# Patient Record
Sex: Female | Born: 1965 | Hispanic: Yes | Marital: Married | Smoking: Never smoker
Health system: Southern US, Community
[De-identification: ages and names within clinical notes are randomized; demographics above are authoritative.]

## PROBLEM LIST (undated history)

## (undated) DIAGNOSIS — K219 Gastro-esophageal reflux disease without esophagitis: Secondary | ICD-10-CM

## (undated) DIAGNOSIS — J45909 Unspecified asthma, uncomplicated: Secondary | ICD-10-CM

## (undated) HISTORY — PX: LAPAROSCOPIC BILATERAL SALPINGO OOPHERECTOMY: SHX5890

## (undated) HISTORY — DX: Gastro-esophageal reflux disease without esophagitis: K21.9

## (undated) HISTORY — PX: ABDOMINAL HYSTERECTOMY: SHX81

## (undated) HISTORY — DX: Unspecified asthma, uncomplicated: J45.909

---

## 2004-10-07 HISTORY — PX: OVARIAN CYST REMOVAL: SHX89

## 2018-03-06 DIAGNOSIS — R109 Unspecified abdominal pain: Secondary | ICD-10-CM | POA: Diagnosis not present

## 2018-03-06 DIAGNOSIS — K219 Gastro-esophageal reflux disease without esophagitis: Secondary | ICD-10-CM | POA: Diagnosis not present

## 2018-04-06 DIAGNOSIS — K219 Gastro-esophageal reflux disease without esophagitis: Secondary | ICD-10-CM | POA: Diagnosis not present

## 2018-04-06 DIAGNOSIS — M25512 Pain in left shoulder: Secondary | ICD-10-CM | POA: Diagnosis not present

## 2018-04-06 DIAGNOSIS — G8929 Other chronic pain: Secondary | ICD-10-CM | POA: Diagnosis not present

## 2018-04-29 DIAGNOSIS — M7502 Adhesive capsulitis of left shoulder: Secondary | ICD-10-CM | POA: Diagnosis not present

## 2018-04-29 DIAGNOSIS — M25512 Pain in left shoulder: Secondary | ICD-10-CM | POA: Diagnosis not present

## 2018-04-29 DIAGNOSIS — G8929 Other chronic pain: Secondary | ICD-10-CM | POA: Diagnosis not present

## 2018-05-20 DIAGNOSIS — M25512 Pain in left shoulder: Secondary | ICD-10-CM | POA: Diagnosis not present

## 2018-05-20 DIAGNOSIS — R29898 Other symptoms and signs involving the musculoskeletal system: Secondary | ICD-10-CM | POA: Diagnosis not present

## 2018-05-27 DIAGNOSIS — M7502 Adhesive capsulitis of left shoulder: Secondary | ICD-10-CM | POA: Diagnosis not present

## 2018-06-10 DIAGNOSIS — R29898 Other symptoms and signs involving the musculoskeletal system: Secondary | ICD-10-CM | POA: Diagnosis not present

## 2018-06-10 DIAGNOSIS — M25512 Pain in left shoulder: Secondary | ICD-10-CM | POA: Diagnosis not present

## 2018-06-30 DIAGNOSIS — R29898 Other symptoms and signs involving the musculoskeletal system: Secondary | ICD-10-CM | POA: Diagnosis not present

## 2018-06-30 DIAGNOSIS — M25512 Pain in left shoulder: Secondary | ICD-10-CM | POA: Diagnosis not present

## 2018-08-13 ENCOUNTER — Ambulatory Visit: Payer: BLUE CROSS/BLUE SHIELD | Admitting: Family Medicine

## 2018-08-13 ENCOUNTER — Encounter: Payer: Self-pay | Admitting: Family Medicine

## 2018-08-13 VITALS — BP 120/80 | HR 64 | Ht <= 58 in | Wt 125.0 lb

## 2018-08-13 DIAGNOSIS — Z7689 Persons encountering health services in other specified circumstances: Secondary | ICD-10-CM | POA: Diagnosis not present

## 2018-08-13 DIAGNOSIS — J4520 Mild intermittent asthma, uncomplicated: Secondary | ICD-10-CM

## 2018-08-13 MED ORDER — ALBUTEROL SULFATE HFA 108 (90 BASE) MCG/ACT IN AERS: 2.0000 | INHALATION_SPRAY | Freq: Four times a day (QID) | RESPIRATORY_TRACT | 2 refills | Status: DC | PRN

## 2018-08-13 NOTE — Progress Notes (Signed)
Date:  08/13/2018   Name:  Kaylee Edwards   DOB:  [birth year]   MRN:  [id number]   Chief Complaint: Establish Care Patient is a 52 year old female who presents for a establish care with new physician exam. The patient reports the following problems: gerd. Health maintenance has been reviewed Up to date.   Review of Systems  Constitutional: Negative.  Negative for chills, fatigue, fever and unexpected weight change.  HENT: Negative for congestion, ear discharge, ear pain, rhinorrhea, sinus pressure, sneezing and sore throat.   Eyes: Negative for photophobia, pain, discharge, redness and itching.  Respiratory: Negative for cough, shortness of breath, wheezing and stridor.   Gastrointestinal: Positive for nausea. Negative for abdominal pain, blood in stool, constipation, diarrhea and vomiting.  Endocrine: Negative for cold intolerance, heat intolerance, polydipsia, polyphagia and polyuria.  Genitourinary: Negative for dysuria, flank pain, frequency, hematuria, menstrual problem, pelvic pain, urgency, vaginal bleeding and vaginal discharge.  Musculoskeletal: Negative for arthralgias, back pain and myalgias.  Skin: Negative for rash.  Allergic/Immunologic: Negative for environmental allergies and food allergies.  Neurological: Positive for light-headedness and headaches. Negative for dizziness, weakness and numbness.  Hematological: Negative for adenopathy. Does not bruise/bleed easily.  Psychiatric/Behavioral: Negative for dysphoric mood. The patient is not nervous/anxious.     There are no active problems to display for this patient.   No Known Allergies  Past Surgical History:  Procedure Laterality Date  . OVARIAN CYST REMOVAL Bilateral 2006    Social History   Tobacco Use  . Smoking status: Never Smoker  . Smokeless tobacco: Never Used  Substance Use Topics  . Alcohol use: Never    Frequency: Never  . Drug use: Never     Medication list has been reviewed and  updated.  Current Meds  Medication Sig  . ranitidine (ZANTAC) 300 MG tablet Take 300 mg by mouth at bedtime. As needed    PHQ 2/9 Scores 08/13/2018  PHQ - 2 Score 0  PHQ- 9 Score 2    Physical Exam  Constitutional: She is oriented to person, place, and time. She appears well-developed and well-nourished.  HENT:  Head: Normocephalic.  Right Ear: External ear normal.  Left Ear: External ear normal.  Mouth/Throat: Oropharynx is clear and moist.  Eyes: Pupils are equal, round, and reactive to light. Conjunctivae and EOM are normal. Lids are everted and swept, no foreign bodies found. Left eye exhibits no hordeolum. No foreign body present in the left eye. Right conjunctiva is not injected. Left conjunctiva is not injected. No scleral icterus.  Neck: Normal range of motion. Neck supple. No JVD present. No tracheal deviation present. No thyromegaly present.  Cardiovascular: Normal rate, regular rhythm, normal heart sounds and intact distal pulses. Exam reveals no gallop and no friction rub.  No murmur heard. Pulmonary/Chest: Effort normal and breath sounds normal. No respiratory distress. She has no wheezes. She has no rales.  Abdominal: Soft. Bowel sounds are normal. She exhibits no mass. There is no hepatosplenomegaly. There is no tenderness. There is no rebound and no guarding.  Musculoskeletal: Normal range of motion. She exhibits no edema or tenderness.  Lymphadenopathy:    She has no cervical adenopathy.  Neurological: She is alert and oriented to person, place, and time. She has normal strength. She displays normal reflexes. No cranial nerve deficit.  Skin: Skin is warm. No rash noted.  Psychiatric: She has a normal mood and affect. Her mood appears not anxious. She does not exhibit a depressed  mood.  Nursing note and vitals reviewed.   BP 120/80   Pulse 64   Ht 4\' 10"  (1.473 m)   Wt 125 lb (56.7 kg)   BMI 26.13 kg/m   Assessment and Plan:  1. Establishing care with new  doctor, encounter for Encounter to establish care with new physician.  2. Mild intermittent asthma without complication Chronic.  Intermittent.  Without acute episode.  Patient desires refill on albuterol inhaler 2 puffs every 6 hours as needed wheezing. - albuterol (PROVENTIL HFA;VENTOLIN HFA) 108 (90 Base) MCG/ACT inhaler; Inhale 2 puffs into the lungs every 6 (six) hours as needed for wheezing or shortness of breath.  Dispense: 1 Inhaler; Refill: 2   Dr. [facility]  08/13/2018

## 2018-09-25 ENCOUNTER — Ambulatory Visit (INDEPENDENT_AMBULATORY_CARE_PROVIDER_SITE_OTHER): Payer: BLUE CROSS/BLUE SHIELD | Admitting: Family Medicine

## 2018-09-25 ENCOUNTER — Encounter: Payer: Self-pay | Admitting: Family Medicine

## 2018-09-25 ENCOUNTER — Other Ambulatory Visit (HOSPITAL_COMMUNITY)
Admission: RE | Admit: 2018-09-25 | Discharge: 2018-09-25 | Disposition: A | Discharge status: Home / Self Care | Payer: BLUE CROSS/BLUE SHIELD | Source: Ambulatory Visit | Attending: Family Medicine | Admitting: Family Medicine

## 2018-09-25 VITALS — BP 120/70 | HR 64 | Ht <= 58 in | Wt 128.0 lb

## 2018-09-25 DIAGNOSIS — Z1272 Encounter for screening for malignant neoplasm of vagina: Secondary | ICD-10-CM

## 2018-09-25 DIAGNOSIS — Z0000 Encounter for general adult medical examination without abnormal findings: Secondary | ICD-10-CM | POA: Insufficient documentation

## 2018-09-25 DIAGNOSIS — K219 Gastro-esophageal reflux disease without esophagitis: Secondary | ICD-10-CM | POA: Diagnosis not present

## 2018-09-25 DIAGNOSIS — J40 Bronchitis, not specified as acute or chronic: Secondary | ICD-10-CM

## 2018-09-25 DIAGNOSIS — Z1211 Encounter for screening for malignant neoplasm of colon: Secondary | ICD-10-CM

## 2018-09-25 LAB — HEMOCCULT GUIAC POC 1CARD (OFFICE): Fecal Occult Blood, POC: NEGATIVE

## 2018-09-25 MED ORDER — BENZONATATE 100 MG PO CAPS: 100.0000 mg | ORAL_CAPSULE | Freq: Two times a day (BID) | ORAL | 0 refills | Status: DC

## 2018-09-25 NOTE — Progress Notes (Signed)
Date:  09/25/2018   Name:  Kaylee Edwards   DOB:  02/08/1966   MRN:  [id number]   Chief Complaint: Annual Exam (needs pap, mammo/ sign a release of info downstairs, and colonoscopy)  Patient is a 52 year old female who presents for a comprehensive physical exam. The patient reports the following problems: cough. Health maintenance has been reviewed pap and pelvic.  Gastroesophageal Reflux  She complains of heartburn. She reports no abdominal pain, no belching, no chest pain, no choking, no coughing, no dysphagia, no globus sensation, no hoarse voice, no nausea, no sore throat, no stridor, no tooth decay, no water brash or no wheezing. This is a chronic problem. The problem occurs occasionally. The problem has been unchanged. The symptoms are aggravated by certain foods (fatty). Pertinent negatives include no anemia, fatigue, melena, orthopnea or weight loss.  Cough  This is a new problem. The current episode started in the past 7 days. The problem has been unchanged. The cough is non-productive. Associated symptoms include heartburn. Pertinent negatives include no chest pain, chills, ear pain, eye redness, fever, headaches, myalgias, rash, rhinorrhea, sore throat, shortness of breath, weight loss or wheezing. There is no history of environmental allergies.    Review of Systems  Constitutional: Negative.  Negative for chills, fatigue, fever, unexpected weight change and weight loss.  HENT: Negative for congestion, ear discharge, ear pain, hoarse voice, rhinorrhea, sinus pressure, sneezing and sore throat.   Eyes: Negative for photophobia, pain, discharge, redness and itching.  Respiratory: Negative for cough, choking, shortness of breath, wheezing and stridor.   Cardiovascular: Negative for chest pain.  Gastrointestinal: Positive for heartburn. Negative for abdominal pain, blood in stool, constipation, diarrhea, dysphagia, melena, nausea and vomiting.  Endocrine: Negative for cold  intolerance, heat intolerance, polydipsia, polyphagia and polyuria.  Genitourinary: Negative for dysuria, flank pain, frequency, hematuria, menstrual problem, pelvic pain, urgency, vaginal bleeding and vaginal discharge.  Musculoskeletal: Negative for arthralgias, back pain and myalgias.  Skin: Negative for rash.  Allergic/Immunologic: Negative for environmental allergies and food allergies.  Neurological: Negative for dizziness, weakness, light-headedness, numbness and headaches.  Hematological: Negative for adenopathy. Does not bruise/bleed easily.  Psychiatric/Behavioral: Negative for dysphoric mood. The patient is not nervous/anxious.     There are no active problems to display for this patient.   No Known Allergies  Past Surgical History:  Procedure Laterality Date  . OVARIAN CYST REMOVAL Bilateral 2006    Social History   Tobacco Use  . Smoking status: Never Smoker  . Smokeless tobacco: Never Used  Substance Use Topics  . Alcohol use: Never    Frequency: Never  . Drug use: Never     Medication list has been reviewed and updated.  Current Meds  Medication Sig  . albuterol (PROVENTIL HFA;VENTOLIN HFA) 108 (90 Base) MCG/ACT inhaler Inhale 2 puffs into the lungs every 6 (six) hours as needed for wheezing or shortness of breath.    PHQ 2/9 Scores 08/13/2018  PHQ - 2 Score 0  PHQ- 9 Score 2    Physical Exam Vitals signs and nursing note reviewed. Exam conducted with a chaperone present.  Constitutional:      General: She is not in acute distress.    Appearance: She is not diaphoretic.  HENT:     Head: Normocephalic and atraumatic.     Right Ear: External ear normal.     Left Ear: External ear normal.     Nose: Nose normal.  Eyes:  General:        Right eye: No discharge.        Left eye: No discharge.     Conjunctiva/sclera: Conjunctivae normal.     Pupils: Pupils are equal, round, and reactive to light.  Neck:     Musculoskeletal: Normal range of motion  and neck supple.     Thyroid: No thyromegaly.     Vascular: No JVD.  Cardiovascular:     Rate and Rhythm: Normal rate and regular rhythm.     Heart sounds: Normal heart sounds. No murmur. No friction rub. No gallop.   Pulmonary:     Effort: Pulmonary effort is normal.     Breath sounds: Normal breath sounds.  Chest:     Chest wall: No mass.     Breasts:        Right: Normal. No swelling, bleeding, inverted nipple, mass, nipple discharge, skin change or tenderness.        Left: Normal. No swelling, bleeding, inverted nipple, mass, nipple discharge, skin change or tenderness.  Abdominal:     General: Bowel sounds are normal.     Palpations: Abdomen is soft. There is no mass.     Tenderness: There is no abdominal tenderness. There is no guarding.  Genitourinary:    General: Normal vulva.     Exam position: Supine.     Pubic Area: No rash.      Labia:        Right: No rash, tenderness or lesion.        Left: No rash, tenderness or lesion.      Urethra: No prolapse, urethral swelling or urethral lesion.     Vagina: Normal. No vaginal discharge.     Adnexa: Right adnexa normal and left adnexa normal.     Rectum: Normal. Guaiac result negative. No tenderness.  Musculoskeletal: Normal range of motion.  Lymphadenopathy:     Cervical: No cervical adenopathy.     Upper Body:     Right upper body: No axillary adenopathy.     Left upper body: No axillary adenopathy.  Skin:    General: Skin is warm and dry.  Neurological:     Mental Status: She is alert.     Deep Tendon Reflexes: Reflexes are normal and symmetric.     BP 120/70   Pulse 64   Ht 4\' 10"  (1.473 m)   Wt 128 lb (58.1 kg)   BMI 26.75 kg/m   Assessment and Plan:  1. Annual physical exam No subjective/objective concerns noted with history and physical.  Lipid panel and renal function was done for maintenance.  Cytology for Pap performed. - Lipid Panel With LDL/HDL Ratio - Renal function panel - Cytology - PAP -  Ambulatory referral to Gastroenterology  2. Bronchitis New onset cough will treat with Tessalon Perles only. - benzonatate (TESSALON) 100 MG capsule; Take 1 capsule (100 mg total) by mouth 2 (two) times daily.  Dispense: 10 capsule; Refill: 0  3. Gastroesophageal reflux disease, esophagitis presence not specified New onset.  Patient unable to take Zantac 300 mg.  Will refer to GI for evaluation and treatment. - benzonatate (TESSALON) 100 MG capsule; Take 1 capsule (100 mg total) by mouth 2 (two) times daily.  Dispense: 10 capsule; Refill: 0 - Ambulatory referral to Gastroenterology  4. Screening for vaginal cancer Pap smear of vaginal cuff performed sent for evaluation of cytology. - Cytology - PAP  5. Colon cancer screening Colon cancer duration discussed referral to  gastroenterology.  Occult was noted to be negative on exam. - Ambulatory referral to Gastroenterology - POCT occult blood stool

## 2018-09-26 LAB — LIPID PANEL WITH LDL/HDL RATIO
Cholesterol, Total: 201 mg/dL — ABNORMAL HIGH (ref 100–199)
HDL: 64 mg/dL (ref >39)
LDL CALC: 112 mg/dL — AB (ref 0–99)
LDl/HDL Ratio: 1.8 ratio (ref 0.0–3.2)
Triglycerides: 126 mg/dL (ref 0–149)
VLDL CHOLESTEROL CAL: 25 mg/dL (ref 5–40)

## 2018-09-26 LAB — RENAL FUNCTION PANEL
Albumin: 4.6 g/dL (ref 3.5–5.5)
BUN / CREAT RATIO: 15 (ref 9–23)
BUN: 10 mg/dL (ref 6–24)
CHLORIDE: 104 mmol/L (ref 96–106)
CO2: 23 mmol/L (ref 20–29)
Calcium: 9.7 mg/dL (ref 8.7–10.2)
Creatinine, Ser: 0.66 mg/dL (ref 0.57–1.00)
GFR calc non Af Amer: 100 mL/min/{1.73_m2} (ref >59)
GFR, EST AFRICAN AMERICAN: 116 mL/min/{1.73_m2} (ref >59)
GLUCOSE: 107 mg/dL — AB (ref 65–99)
POTASSIUM: 4.3 mmol/L (ref 3.5–5.2)
Phosphorus: 3.5 mg/dL (ref 2.5–4.5)
SODIUM: 142 mmol/L (ref 134–144)

## 2018-09-29 LAB — CYTOLOGY - PAP
Diagnosis: NEGATIVE
HPV: NOT DETECTED

## 2018-10-13 ENCOUNTER — Ambulatory Visit: Payer: BLUE CROSS/BLUE SHIELD | Admitting: Gastroenterology

## 2018-10-13 ENCOUNTER — Encounter: Payer: Self-pay | Admitting: Gastroenterology

## 2018-10-13 ENCOUNTER — Other Ambulatory Visit: Payer: Self-pay

## 2018-10-13 VITALS — BP 147/76 | HR 56 | Ht <= 58 in | Wt 127.8 lb

## 2018-10-13 DIAGNOSIS — K219 Gastro-esophageal reflux disease without esophagitis: Secondary | ICD-10-CM | POA: Diagnosis not present

## 2018-10-13 DIAGNOSIS — Z1211 Encounter for screening for malignant neoplasm of colon: Secondary | ICD-10-CM

## 2018-10-13 MED ORDER — OMEPRAZOLE 20 MG PO CPDR: 20.0000 mg | DELAYED_RELEASE_CAPSULE | Freq: Every day | ORAL | 0 refills | Status: AC

## 2018-10-13 NOTE — Progress Notes (Signed)
Kaylee Edwards [address]  [address]  [place], [place] [address]  Main: [phone]  Fax: [phone]   Gastroenterology Consultation  Referring Provider:     Duanne LimerickJones, Deanna C, MD Primary Care Physician:  Duanne LimerickJones, Deanna C, MD Primary Gastroenterologist:  Dr. Melodie BouillonVarnita Ahnaf Caponi Reason for Consultation:     GERD, colon cancer screening        HPI:    Chief Complaint  Patient presents with  . New Patient (Initial Visit)    referral Dr. Dub Amis. Jones for GERD, Colonoscopy screening    Kaylee Edwards is a 53 y.o. y/o female referred for consultation & management  by Dr. Duanne LimerickJones, Deanna C, MD.  Patient reports 1 to 2034-month history of acid reflux, regurgitation.  States frequency depends on what she eats, and spicy foods exacerbate the symptoms.  Symptoms are also associated with abdominal bloating.  It can occur anywhere from 1-2 times a week to daily depending on what she is eating.  No dysphagia.  No weight loss.  No nausea or vomiting.  Was started on Zantac which she took daily and this did not help her symptoms.  No prior EGD or colonoscopy.  No family history of colon cancer.  Past Medical History:  Diagnosis Date  . Asthma   . GERD (gastroesophageal reflux disease)     Past Surgical History:  Procedure Laterality Date  . OVARIAN CYST REMOVAL Bilateral 2006    Prior to Admission medications   Medication Sig Start Date End Date Taking? Authorizing Provider  albuterol (PROVENTIL HFA;VENTOLIN HFA) 108 (90 Base) MCG/ACT inhaler Inhale 2 puffs into the lungs every 6 (six) hours as needed for wheezing or shortness of breath. 08/13/18  Yes Duanne LimerickJones, Deanna C, MD  benzonatate (TESSALON) 100 MG capsule Take 1 capsule (100 mg total) by mouth 2 (two) times daily. Patient not taking: Reported on 10/13/2018 09/25/18   Duanne LimerickJones, Deanna C, MD    Family History  Problem Relation Age of Onset  . Diabetes Mother      Social History   Tobacco Use  . Smoking status: Never Smoker  .  Smokeless tobacco: Never Used  Substance Use Topics  . Alcohol use: Never    Frequency: Never  . Drug use: Never    Allergies as of 10/13/2018  . (No Known Allergies)    Review of Systems:    All systems reviewed and negative except where noted in HPI.   Physical Exam:  BP (!) 147/76   Pulse (!) 56   Ht 4\' 10"  (1.473 m)   Wt 127 lb 12.8 oz (58 kg)   BMI 26.71 kg/m  No LMP recorded. Patient is postmenopausal. Psych:  Alert and cooperative. Normal mood and affect. General:   Alert,  Well-developed, well-nourished, pleasant and cooperative in NAD Head:  Normocephalic and atraumatic. Eyes:  Sclera clear, no icterus.   Conjunctiva pink. Ears:  Normal auditory acuity. Nose:  No deformity, discharge, or lesions. Mouth:  No deformity or lesions,oropharynx pink & moist. Neck:  Supple; no masses or thyromegaly. Abdomen:  Normal bowel sounds.  No bruits.  Soft, non-tender and non-distended without masses, hepatosplenomegaly or hernias noted.  No guarding or rebound tenderness.    Msk:  Symmetrical without gross deformities. Good, equal movement & strength bilaterally. Pulses:  Normal pulses noted. Extremities:  No clubbing or edema.  No cyanosis. Neurologic:  Alert and oriented x3;  grossly normal neurologically. Skin:  Intact without significant lesions or rashes. No jaundice. Lymph Nodes:  No significant cervical adenopathy. Psych:  Alert and cooperative. Normal mood and affect.   Labs: CBC No results found for: WBC, RBC, HGB, HCT, PLT, MCV, MCH, MCHC, RDW, LYMPHSABS, MONOABS, EOSABS, BASOSABS CMP     Component Value Date/Time   NA 142 09/25/2018 0935   K 4.3 09/25/2018 0935   CL 104 09/25/2018 0935   CO2 23 09/25/2018 0935   GLUCOSE 107 (H) 09/25/2018 0935   BUN 10 09/25/2018 0935   CREATININE 0.66 09/25/2018 0935   CALCIUM 9.7 09/25/2018 0935   ALBUMIN 4.6 09/25/2018 0935   GFRNONAA 100 09/25/2018 0935   GFRAA 116 09/25/2018 0935    Imaging Studies: No results  found.  Assessment and Plan:   Kaylee SalonBlanca Edwards is a 53 y.o. y/o female has been referred for reflux and colon cancer screening  Patient symptoms are consistent with underlying acid reflux Zantac did not help Therefore, will start low-dose PPI, omeprazole 20 mg 30 minutes before breakfast daily Patient educated extensively on acid reflux lifestyle modification, including buying a bed wedge, not eating 3 hrs before bedtime, diet modifications, and handout given for the same.   However, due to daily symptoms for 1 to 2 months not relieved with H2 RA, we have discussed that EGD would allow us to rule out Barrett's, obtain biopsies for H. pylori given her abdominal bloating, and rule out any other underlying lesions.  She is agreeable with the procedure.  She is also due for colorectal cancer screening and we discussed this as well.  Patient is agreeable to proceeding.  I have discussed alternative options, risks & benefits,  which include, but are not limited to, bleeding, infection, perforation,respiratory complication & drug reaction.  The patient agrees with this plan & written consent will be obtained.    Entire evaluation took place with the help of a Spanish interpreter   Dr Kaylee BouillonVarnita Enjoli Tidd  Speech recognition software was used to dictate the above note.

## 2018-10-13 NOTE — Patient Instructions (Signed)
Enfermedad de reflujo gastroesofgico en los adultos  Gastroesophageal Reflux Disease, Adult  El reflujo gastroesofgico (RGE) ocurre cuando el cido del estmago sube por el tubo que conecta la boca con el estmago (esfago). Normalmente, la comida baja por el esfago y se mantiene en el estmago, donde se la digiere. Cuando una persona tiene RGE, los alimentos y el cido estomacal suelen volver al esfago. Usted puede tener una enfermedad llamada enfermedad de reflujo gastroesofgico (ERGE) si el reflujo:   Sucede a menudo.   Causa sntomas frecuentes o muy intensos.   Causa problemas tales como dao en el esfago.  Cuando esto ocurre, el esfago duele y se hincha (inflama). Con el tiempo, la ERGE puede ocasionar pequeos agujeros (lceras) en el revestimiento del esfago.  Cules son las causas?  Esta afeccin se debe a un problema en el msculo que se encuentra entre el esfago y el estmago. Cuando este msculo est dbil o no es normal, no se cierra correctamente para impedir que los alimentos y el cido regresen del estmago. El msculo puede debilitarse debido a lo siguiente:   El consumo de tabaco.   Embarazo.   Tener cierto tipo de hernia (hernia de hiato).   Consumo de alcohol.   Ciertos alimentos y bebidas, como caf, chocolate, cebollas y menta.  Qu incrementa el riesgo?  Es ms probable que tenga esta afeccin si:   Tiene sobrepeso.   Tiene una enfermedad que afecta el tejido conjuntivo.   Usa antiinflamatorios no esteroideos (AINE).  Cules son los signos o los sntomas?  Los sntomas de esta afeccin incluyen:   Acidez estomacal.   Dificultad o dolor al tragar.   Sensacin de tener un bulto en la garganta.   Sabor amargo en la boca.   Mal aliento.   Tener una gran cantidad de saliva.   Estmago inflamado o con malestar.   Eructos.   Dolor en el pecho. El dolor de pecho puede deberse a distintas afecciones. Asegrese de consultar a su mdico si tiene dolor en el pecho.   Falta  de aire o respiracin ruidosa (sibilancias).   Tos constante (crnica) o durante la noche.   Desgaste de la superficie de los dientes (esmalte dental).   Prdida de peso.  Cmo se trata?  El tratamiento depender de la gravedad de los sntomas. El mdico puede sugerirle lo siguiente:   Cambios en la dieta.   Medicamentos.   Una ciruga.  Siga estas indicaciones en su casa:  Comida y bebida     Siga una dieta como se lo haya indicado el mdico. Es posible que deba evitar alimentos y bebidas, por ejemplo:  ? Caf y t (con o sin cafena).  ? Bebidas que contengan alcohol.  ? Bebidas energticas y deportivas.  ? Bebidas gaseosas y refrescos.  ? Chocolate y cacao.  ? Menta y esencia de menta.  ? Ajo y cebolla.  ? Rbano picante.  ? Alimentos cidos y condimentados. Estos incluyen todos los tipos de pimientos, chile en polvo, curry en polvo, vinagre, salsas picantes y salsa barbacoa.  ? Ctricos y sus jugos, por ejemplo, naranjas, limones y limas.  ? Alimentos que contengan tomate. Estos incluyen salsa roja, chile, salsa picante y pizza con salsa de tomate.  ? Alimentos fritos y grasos. Estos incluyen donas, papas fritas, papitas fritas de bolsa y aderezos con alto contenido de grasa.  ? Carnes con alto contenido de grasa. Estas incluye los perros calientes, chuletas o costillas, embutidos, jamn y tocino.  ?   Productos lcteos ricos en grasas, como leche entera, manteca y queso crema.   Consuma pequeas cantidades de comida con ms frecuencia. Evite consumir porciones abundantes.   Evite beber grandes cantidades de lquidos con las comidas.   Evite comer 2 o 3horas antes de acostarse.   Evite recostarse inmediatamente despus de comer.   No haga ejercicios enseguida despus de comer.  Estilo de vida     No consuma ningn producto que contenga nicotina o tabaco. Estos incluyen cigarrillos, cigarrillos electrnicos y tabaco para mascar. Si necesita ayuda para dejar de fumar, consulte al mdico.   Intente  reducir el nivel de estrs. Si necesita ayuda para hacer esto, consulte al mdico.   Si tiene sobrepeso, baje una cantidad de peso saludable para usted. Consulte a su mdico para bajar de peso de manera segura.  Indicaciones generales   Est atento a cualquier cambio en los sntomas.   Tome los medicamentos de venta libre y los recetados solamente como se lo haya indicado el mdico. No tome aspirina, ibuprofeno ni otros AINE a menos que el mdico lo autorice.   Use ropa holgada. No use nada apretado alrededor de la cintura.   Levante (eleve) la cabecera de la cama aproximadamente 6pulgadas (15cm).   Evite inclinarse si al hacerlo empeoran los sntomas.   Concurra a todas las visitas de seguimiento como se lo haya indicado el mdico. Esto es importante.  Comunquese con un mdico si:   Aparecen nuevos sntomas.   Adelgaza y no sabe por qu.   Tiene problemas para tragar o le duele cuando traga.   Tiene sibilancias o tos persistente.   Los sntomas no mejoran con el tratamiento.   Tiene la voz ronca.  Solicite ayuda inmediatamente si:   Siente dolor en los brazos, el cuello, la mandbula, los dientes o la espalda.   Se siente transpirado, mareado o tiene una sensacin de desvanecimiento.   Siente falta de aire o dolor en el pecho.   Vomita y el vmito tiene un aspecto similar a la sangre o a los posos de caf.   Pierde el conocimiento (se desmaya).   Las deposiciones (heces) son sanguinolentas o negras.   No puede tragar, beber o comer.  Resumen   Si una persona tiene enfermedad de reflujo gastroesofgico (ERGE), los alimentos y el cido estomacal suben al esfago y causan sntomas o problemas tales como dao en el esfago.   El tratamiento depender de la gravedad de los sntomas.   Siga una dieta como se lo haya indicado el mdico.   Tome todos los medicamentos solamente como se lo haya indicado el mdico.  Esta informacin no tiene como fin reemplazar el consejo del mdico. Asegrese de  hacerle al mdico cualquier pregunta que tenga.  Document Released: 10/26/2010 Document Revised: 05/07/2018 Document Reviewed: 05/07/2018  Elsevier Interactive Patient Education  2019 [employer]

## 2018-10-29 ENCOUNTER — Telehealth: Payer: Self-pay | Admitting: Gastroenterology

## 2018-10-29 NOTE — Telephone Encounter (Signed)
Endo notified of cancellation.

## 2018-10-29 NOTE — Telephone Encounter (Signed)
Pt daughter called to cancel pt procedure for 11/03/18 due to receiving a call from registration and the Price the procedure would cost

## 2018-11-03 ENCOUNTER — Encounter: Admission: RE | Payer: Self-pay | Source: Home / Self Care

## 2018-11-03 ENCOUNTER — Ambulatory Visit
Admission: RE | Admit: 2018-11-03 | Payer: BLUE CROSS/BLUE SHIELD | Source: Home / Self Care | Admitting: Gastroenterology

## 2018-11-03 SURGERY — COLONOSCOPY WITH PROPOFOL
Anesthesia: Choice

## 2018-11-04 ENCOUNTER — Other Ambulatory Visit: Payer: Self-pay | Admitting: Gastroenterology

## 2020-03-30 ENCOUNTER — Telehealth: Payer: Self-pay | Admitting: Family Medicine

## 2020-03-30 NOTE — Telephone Encounter (Signed)
Copied from CRM [id number]. Topic: Appointment Scheduling - New Patient >> Mar 27, 2020 11:51 AM Darron Doom wrote: New patient has been scheduled for your office. Provider: Saralyn Pilar  Date of Appointment: 04/04/2020  Route to department's PEC pool.

## 2020-04-04 ENCOUNTER — Ambulatory Visit: Payer: BLUE CROSS/BLUE SHIELD | Admitting: Family Medicine

## 2020-07-30 ENCOUNTER — Emergency Department: Payer: PRIVATE HEALTH INSURANCE

## 2020-07-30 ENCOUNTER — Other Ambulatory Visit: Payer: Self-pay

## 2020-07-30 DIAGNOSIS — E860 Dehydration: Secondary | ICD-10-CM | POA: Diagnosis not present

## 2020-07-30 DIAGNOSIS — E1165 Type 2 diabetes mellitus with hyperglycemia: Secondary | ICD-10-CM | POA: Diagnosis not present

## 2020-07-30 DIAGNOSIS — R55 Syncope and collapse: Secondary | ICD-10-CM | POA: Diagnosis present

## 2020-07-30 DIAGNOSIS — J45909 Unspecified asthma, uncomplicated: Secondary | ICD-10-CM | POA: Diagnosis not present

## 2020-07-30 LAB — CBC
HCT: 35.7 % — ABNORMAL LOW (ref 36.0–46.0)
Hemoglobin: 11.9 g/dL — ABNORMAL LOW (ref 12.0–15.0)
MCH: 27.5 pg (ref 26.0–34.0)
MCHC: 33.3 g/dL (ref 30.0–36.0)
MCV: 82.4 fL (ref 80.0–100.0)
Platelets: 321 10*3/uL (ref 150–400)
RBC: 4.33 MIL/uL (ref 3.87–5.11)
RDW: 13.1 % (ref 11.5–15.5)
WBC: 10.3 10*3/uL (ref 4.0–10.5)
nRBC: 0 % (ref 0.0–0.2)

## 2020-07-30 LAB — BASIC METABOLIC PANEL
Anion gap: 6 (ref 5–15)
BUN: 25 mg/dL — ABNORMAL HIGH (ref 6–20)
CO2: 27 mmol/L (ref 22–32)
Calcium: 8.5 mg/dL — ABNORMAL LOW (ref 8.9–10.3)
Chloride: 107 mmol/L (ref 98–111)
Creatinine, Ser: 1.25 mg/dL — ABNORMAL HIGH (ref 0.44–1.00)
GFR, Estimated: 51 mL/min — ABNORMAL LOW (ref >60)
Glucose, Bld: 240 mg/dL — ABNORMAL HIGH (ref 70–99)
Potassium: 3.6 mmol/L (ref 3.5–5.1)
Sodium: 140 mmol/L (ref 135–145)

## 2020-07-30 LAB — LIPASE, BLOOD: Lipase: 29 U/L (ref 11–51)

## 2020-07-30 LAB — TROPONIN I (HIGH SENSITIVITY): Troponin I (High Sensitivity): 7 ng/L (ref <18)

## 2020-07-30 NOTE — ED Triage Notes (Addendum)
PT to ED via EMS from home. PT had syncopal episode while in the living room while she was just standing. On EMS arrival pt BP was 63/41. EMS gave NS and BP up to 112/74. PT alert and keenly responisve at this time. PT also complains of CP and abd tenderness.

## 2020-07-31 ENCOUNTER — Emergency Department
Admission: EM | Admit: 2020-07-31 | Discharge: 2020-07-31 | Disposition: A | Discharge status: Home / Self Care | Payer: PRIVATE HEALTH INSURANCE | Attending: Emergency Medicine | Admitting: Emergency Medicine

## 2020-07-31 ENCOUNTER — Emergency Department: Payer: PRIVATE HEALTH INSURANCE

## 2020-07-31 DIAGNOSIS — R739 Hyperglycemia, unspecified: Secondary | ICD-10-CM

## 2020-07-31 DIAGNOSIS — E860 Dehydration: Secondary | ICD-10-CM

## 2020-07-31 DIAGNOSIS — E119 Type 2 diabetes mellitus without complications: Secondary | ICD-10-CM

## 2020-07-31 DIAGNOSIS — R55 Syncope and collapse: Secondary | ICD-10-CM

## 2020-07-31 LAB — URINALYSIS, COMPLETE (UACMP) WITH MICROSCOPIC
Bacteria, UA: NONE SEEN
Bilirubin Urine: NEGATIVE
Glucose, UA: NEGATIVE mg/dL
Ketones, ur: NEGATIVE mg/dL
Leukocytes,Ua: NEGATIVE
Nitrite: NEGATIVE
Protein, ur: NEGATIVE mg/dL
Specific Gravity, Urine: 1.019 (ref 1.005–1.030)
pH: 5 (ref 5.0–8.0)

## 2020-07-31 LAB — TROPONIN I (HIGH SENSITIVITY): Troponin I (High Sensitivity): 7 ng/L (ref <18)

## 2020-07-31 LAB — HEMOGLOBIN A1C
Hgb A1c MFr Bld: 6.7 % — ABNORMAL HIGH (ref 4.8–5.6)
Mean Plasma Glucose: 146 mg/dL

## 2020-07-31 LAB — GLUCOSE, CAPILLARY: Glucose-Capillary: 104 mg/dL — ABNORMAL HIGH (ref 70–99)

## 2020-07-31 MED ORDER — LACTATED RINGERS IV BOLUS
1000.0000 mL | Freq: Once | INTRAVENOUS | Status: AC
  Administered 2020-07-31: 1000 mL via INTRAVENOUS

## 2020-07-31 NOTE — ED Notes (Signed)
Patient placed in stretcher and on monitor. Using intepreter serviced patient endorses that they were attempting to get up from the couch this evening when they has a syncopal event and does not remember what happened after that. Pt +LOC. No injuries or pain noted at this time. Patient endorsing that this has never happened before.

## 2020-07-31 NOTE — ED Provider Notes (Signed)
[facility] Emergency Department Provider Note  ____________________________________________  Time seen: Approximately 12:42 AM  I have reviewed the triage vital signs and the nursing notes.   HISTORY  Chief Complaint Loss of Consciousness   HPI Kaylee Edwards is a 54 y.o. female with a history of asthma and GERD who presents for evaluation of syncope.  Patient reports that around 6 PM this evening she started having a moderate throbbing headache.  She does have a history of similar headaches.  An hour later, the headache had not resolved so she decided to take 2 Aleve.  About an hour after taking Aleve patient started feeling dizzy.  She was standing talking to her husband when she noticed tunnel vision and she passed out.  She reports falling on her buttock.  She did not hit her head.  She immediately regained consciousness.  Her daughter called 911 and when EMS arrived patient was hypotensive with BP of 63/41.  She received 250 cc of normal saline in route.  She reports eating and drinking normally today, no fever or chills, no neck stiffness, no cough, no nausea, vomiting, diarrhea, abdominal pain, no chest pain or shortness of breath. No thunderclap HA.  Triage note says patient was also complaining of CP and abd pain. Patient denies having either one. She does report sometimes having chest pain and upper abd pain when she eats fatty foods which she attributes to indigestion but none today or now.  She reports that her headache is fully resolved at this time.  She denies personal history of diabetes but does have family history of such and her mother.  Past Medical History:  Diagnosis Date   Asthma    GERD (gastroesophageal reflux disease)      Past Surgical History:  Procedure Laterality Date   OVARIAN CYST REMOVAL Bilateral 2006    Prior to Admission medications   Medication Sig Start Date End Date Taking? Authorizing Provider  albuterol  (PROVENTIL HFA;VENTOLIN HFA) 108 (90 Base) MCG/ACT inhaler Inhale 2 puffs into the lungs every 6 (six) hours as needed for wheezing or shortness of breath. 08/13/18   Duanne Limerick, MD  benzonatate (TESSALON) 100 MG capsule Take 1 capsule (100 mg total) by mouth 2 (two) times daily. Patient not taking: Reported on 10/13/2018 09/25/18   Duanne Limerick, MD  omeprazole (PRILOSEC) 20 MG capsule Take 1 capsule (20 mg total) by mouth daily. 10/13/18   Pasty Spillers, MD    Allergies Patient has no known allergies.  Family History  Problem Relation Age of Onset   Diabetes Mother     Social History Social History   Tobacco Use   Smoking status: Never Smoker   Smokeless tobacco: Never Used  Substance Use Topics   Alcohol use: Never   Drug use: Never    Review of Systems  Constitutional: Negative for fever. + syncope Eyes: Negative for visual changes. ENT: Negative for sore throat. Neck: No neck pain  Cardiovascular: Negative for chest pain. Respiratory: Negative for shortness of breath. Gastrointestinal: Negative for abdominal pain, vomiting or diarrhea. Genitourinary: Negative for dysuria. Musculoskeletal: Negative for back pain. Skin: Negative for rash. Neurological: Negative for weakness or numbness. + HA Psych: No SI or HI  ____________________________________________   PHYSICAL EXAM:  VITAL SIGNS: ED Triage Vitals  Enc Vitals Group     BP 07/30/20 2241 108/68     Pulse Rate 07/30/20 2241 (!) 59     Resp 07/30/20 2241 18  Temp 07/30/20 2241 98.7 F (37.1 C)     Temp Source 07/30/20 2241 Oral     SpO2 07/30/20 2241 96 %     Weight 07/30/20 2242 127 lb 13.9 oz (58 kg)     Height 07/30/20 2242 4\' 10"  (1.473 m)     Head Circumference --      Peak Flow --      Pain Score 07/30/20 2246 0     Pain Loc --      Pain Edu? --      Excl. in GC? --     Constitutional: Alert and oriented. Well appearing and in no apparent distress. HEENT:      Head:  Normocephalic and atraumatic.         Eyes: Conjunctivae are normal. Sclera is non-icteric.       Mouth/Throat: Mucous membranes are moist.       Neck: Supple with no signs of meningismus.  No C-spine tenderness Cardiovascular: Regular rate and rhythm. No murmurs, gallops, or rubs. 2+ symmetrical distal pulses are present in all extremities. No JVD. Respiratory: Normal respiratory effort. Lungs are clear to auscultation bilaterally. No wheezes, crackles, or rhonchi.  Gastrointestinal: Soft, non tender, and non distended. Musculoskeletal: Nontender with normal range of motion in all extremities.  No midline T and L spine tenderness.  No edema, cyanosis, or erythema of extremities. Neurologic: Normal speech and language. Face is symmetric. EOMI, PERRL, intact strength and sensation x4, normal gait, no dysmetria, no pronator Skin: Skin is warm, dry and intact. No rash noted. Psychiatric: Mood and affect are normal. Speech and behavior are normal.  ____________________________________________   LABS (all labs ordered are listed, but only abnormal results are displayed)  Labs Reviewed  BASIC METABOLIC PANEL - Abnormal; Notable for the following components:      Result Value   Glucose, Bld 240 (*)    BUN 25 (*)    Creatinine, Ser 1.25 (*)    Calcium 8.5 (*)    GFR, Estimated 51 (*)    All other components within normal limits  CBC - Abnormal; Notable for the following components:   Hemoglobin 11.9 (*)    HCT 35.7 (*)    All other components within normal limits  URINALYSIS, COMPLETE (UACMP) WITH MICROSCOPIC - Abnormal; Notable for the following components:   Color, Urine YELLOW (*)    APPearance HAZY (*)    Hgb urine dipstick SMALL (*)    All other components within normal limits  GLUCOSE, CAPILLARY - Abnormal; Notable for the following components:   Glucose-Capillary 104 (*)    All other components within normal limits  HEMOGLOBIN A1C - Abnormal; Notable for the following components:    Hgb A1c MFr Bld 6.7 (*)    All other components within normal limits  LIPASE, BLOOD  CBG MONITORING, ED  TROPONIN I (HIGH SENSITIVITY)  TROPONIN I (HIGH SENSITIVITY)   ____________________________________________  EKG  ED ECG REPORT I, 08/01/20, the attending physician, personally viewed and interpreted this ECG.  Normal sinus rhythm, rate of 67, left axis deviation, right bundle branch block, normal QTC.  No prior for comparison. ____________________________________________  RADIOLOGY  I have personally reviewed the images performed during this visit and I agree with the Radiologist's read.   Interpretation by Radiologist:  No results found.   ____________________________________________   PROCEDURES  Procedure(s) performed:yes .1-3 Lead EKG Interpretation Performed by: Nita Sickle, MD Authorized by: Nita Sickle, MD     Interpretation: abnormal  ECG rate assessment: normal     Rhythm: sinus rhythm     Conduction: abnormal     Abnormal conduction comment:  RBBB   Critical Care performed:  None ____________________________________________   INITIAL IMPRESSION / ASSESSMENT AND PLAN / ED COURSE  54 y.o. female with a history of asthma and GERD who presents for evaluation of syncope preceeded by a moderate HA, lightheadedness, tunnel vision.  At this time patient is hemodynamically stable with normal vital signs, completely normal neurological exam, no signs of trauma.  Her EKG is abnormal showing a left axis deviation and right bundle branch block but review of epic shows no prior for comparison.  Patient denies any chest pain or shortness of breath, she has no tachypnea, tachycardia, or hypoxia.  Therefore low suspicion for PE.  Possibly vasovagal since patient was found to be hypotensive per EMS but will check labs to rule out anemia, AKI, significant electrolyte derangements, new onset of diabetes.  Will monitor patient on telemetry for any  signs of cardiac dysrhythmias.  Blood work showing blood glucose of 240 and AKI with creatinine of 1.25.  Patient has not had any blood work done since 2019 when her creatinine and blood glucose were normal.  She is also mildly anemic with hemoglobin of 11.9.  No signs of bleeding.  She is not any blood thinners.  Initial troponin is negative.  We will get orthostatic vital signs, will give IV fluids and repeat CBG.  Most likely new onset of diabetes causing dehydration.  Since patient had a headache prior to the syncopal event we will get a head CT.  She denies thunderclap headache or any neurological deficits.  No signs of stroke on exam.     _________________________ 4:11 AM on 07/31/2020 -----------------------------------------  Patient monitored on telemetry for a couple of hours.  I reviewed every alarm and the entire strip for her stay in the emergency room with no signs of dysrhythmias.  UA with no signs of glycosuria.  Since patient's blood glucose was nonfasting and she has no glucose in her urine I will hold off starting her on any medications for possible diabetes.  I will add on a hemoglobin A1c and if that is elevated I will call patient with a prescription.  The meantime I am referring her to [facility] clinic to establish care for further management of this finding.  Orthostatic vital signs here were negative.  Her repeat CBG normalized after liter fluids.  Head CT was visualized by me with no acute intracranial findings, confirmed by radiology.  Recommend increase oral hydration and discussed my standard return precautions with patient.   _________________________ 6:47 AM on 08/01/2020 -----------------------------------------  hgb A1C elevated at 6.7. Attempted to contact patient twice and left a message on voicemail to discuss results and to get patient's pharmacy so metformin can be prescribed. Will try again later.  _________________________ 3:41 AM on  08/03/2020 -----------------------------------------  Multiple attempts to contact this patient over several days with messages left on her cell phone have been unsuccessful.  I have forwarded the chart to Sheran Luz RN to have patient contacted to discuss results of elevated hgb A1c and need to initiate metformin 500mg  BID.  _____________________________________________ Please note:  Patient was evaluated in Emergency Department today for the symptoms described in the history of present illness. Patient was evaluated in the context of the global COVID-19 pandemic, which necessitated consideration that the patient might be at risk for infection with the SARS-CoV-2 virus that causes  COVID-19. Institutional protocols and algorithms that pertain to the evaluation of patients at risk for COVID-19 are in a state of rapid change based on information released by regulatory bodies including the CDC and federal and state organizations. These policies and algorithms were followed during the patient's care in the ED.  Some ED evaluations and interventions may be delayed as a result of limited staffing during the pandemic.   [place] Controlled Substance Database was reviewed by me. ____________________________________________   FINAL CLINICAL IMPRESSION(S) / ED DIAGNOSES   Final diagnoses:  Syncope, unspecified syncope type  Dehydration  Hyperglycemia  New onset type 2 diabetes mellitus (HCC)      NEW MEDICATIONS STARTED DURING THIS VISIT:  ED Discharge Orders    None       Note:  This document was prepared using Dragon voice recognition software and may include unintentional dictation errors.    Don PerkingVeronese, [place], MD 08/03/20 [phone]

## 2020-07-31 NOTE — ED Notes (Signed)
Repeat cbg 104

## 2020-07-31 NOTE — Discharge Instructions (Addendum)
Su nivel de glucosa en sangre se elev en el hospital debido a una posible diabetes. Asegrese de programar una cita con un mdico de atencin primaria dentro de la prxima semana para Warden/ranger atencin y ser evaluado en busca de niveles elevados de glucosa en [place]. Beba mucha agua durante los prximos das para mantenerse hidratado. Regrese a la sala de emergencias si tiene dolor en el pecho, dificultad para respirar, dolor abdominal, fiebre o si se desmaya nuevamente.  Your blood glucose was elevated in the hospital concerning for possible diabetes. Make sure to schedule an appointment with a primary care doctor within the next week to establish care and to be evaluated for the elevated blood glucose. Drink lots of water over the next few days to keep yourself hydrated. Return to the ER of you have chest pain, shortness of breath, abdominal pain, fever, or if you pass out again.

## 2020-08-03 ENCOUNTER — Telehealth: Payer: Self-pay | Admitting: Emergency Medicine

## 2020-08-03 NOTE — Telephone Encounter (Signed)
Called patient at request of Dr. Don Perking to inform of A1C results and need for prescription for metformin.  Tobie Lords [facility] interpreter to interpret.   The patient did not answer, but Larena Sox left a message asking her to call us back.

## 2020-08-14 NOTE — Telephone Encounter (Signed)
Patient called back and via jackie [facility] interpretter I explained A1C elevated and need for medication to keep sugar down.  I called metformin 500 mg twice daily with quantity for one month supply to [facility] at pt request.  Patient said she has appointment with new pcp on December 8th.

## 2022-04-14 IMAGING — CT CT HEAD W/O CM
3 series · 16 of 46 positions shown, 19 images · non-contrast
Comparison: None.

CLINICAL DATA: Headache

EXAM:
CT HEAD WITHOUT CONTRAST
TECHNIQUE: Contiguous axial images were obtained from the base of the skull
through the vertex without intravenous contrast.

[Series 3: head wo · axial · 0.40mm/px · z∈[+575,+695]mm · 10 of 29 slices shown, 13 images]
[im 3/29  brain]
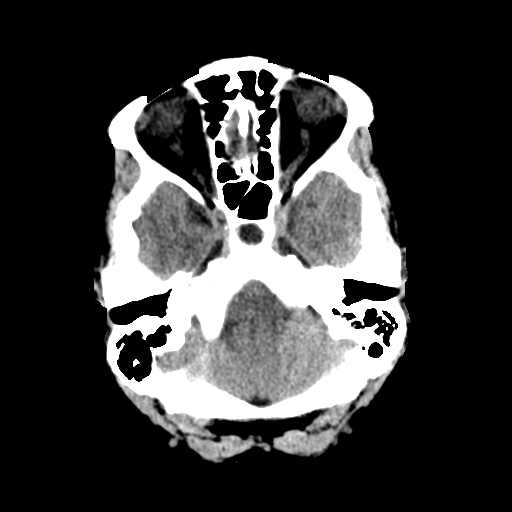
[im 3/29  bone]
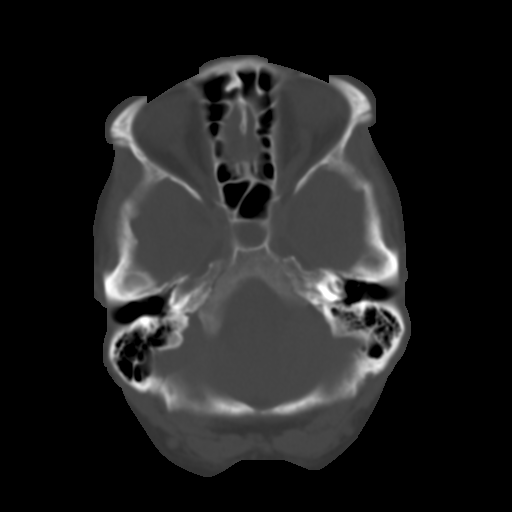
[im 6/29  brain]
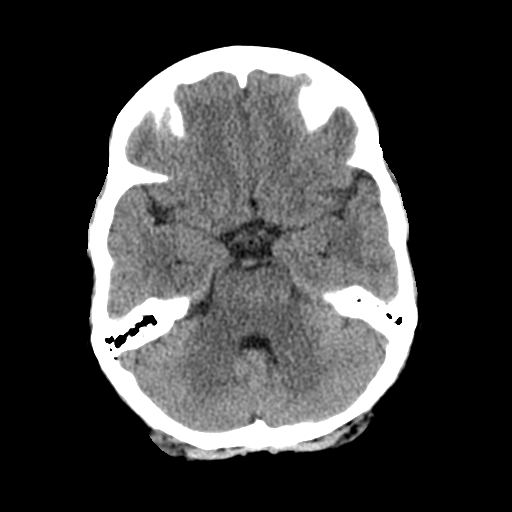
[im 8/29  brain]
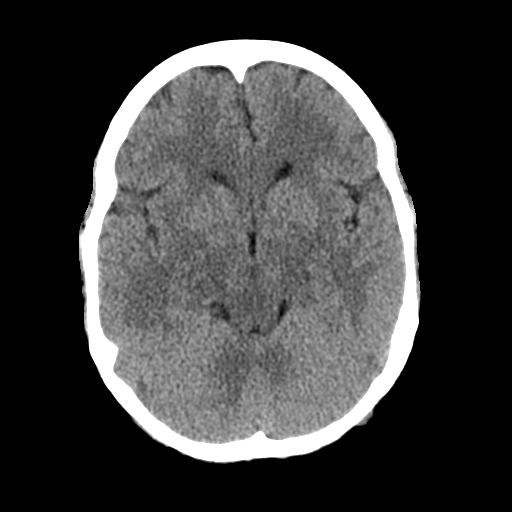
[im 11/29  brain]
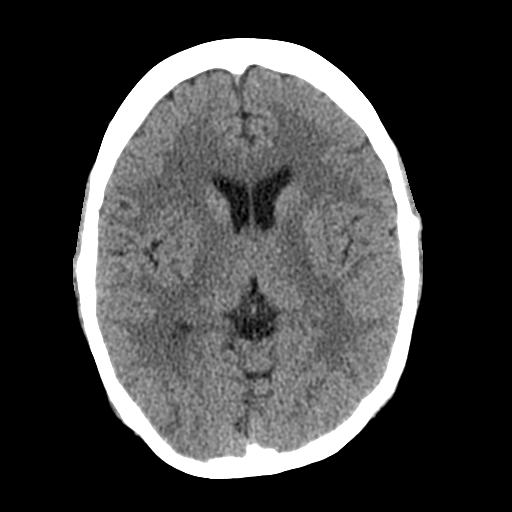
[im 14/29  brain]
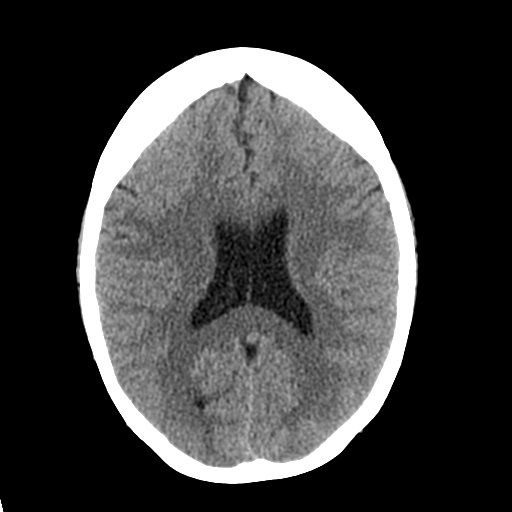
[im 14/29  bone]
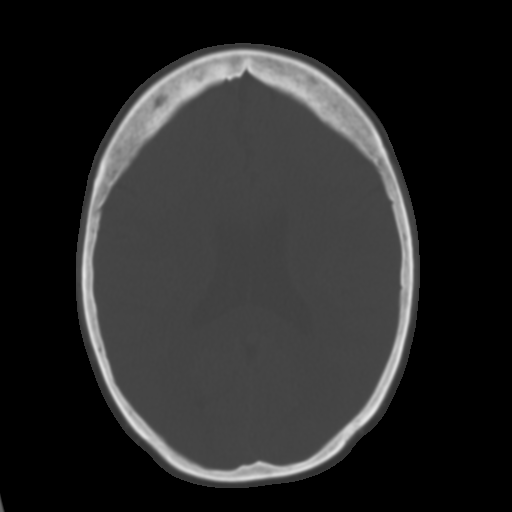
[im 16/29  brain]
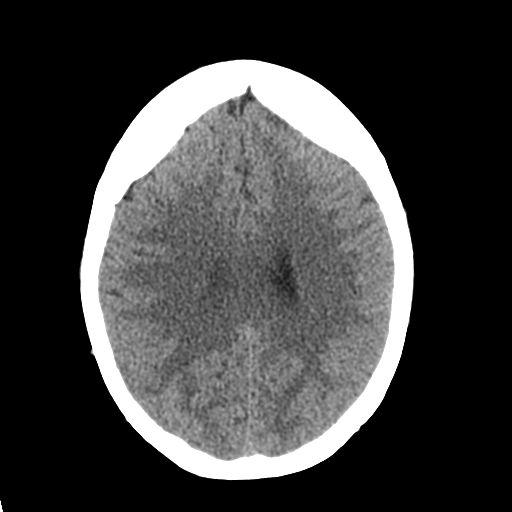
[im 19/29  brain]
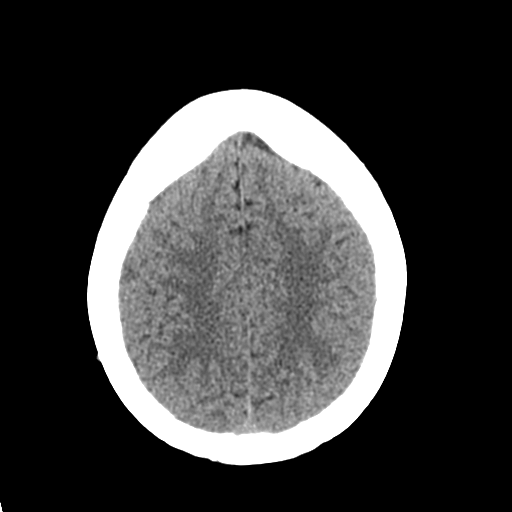
[im 22/29  brain]
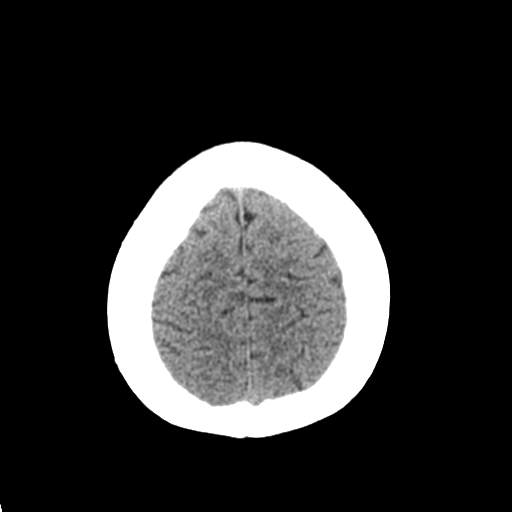
[im 24/29  brain]
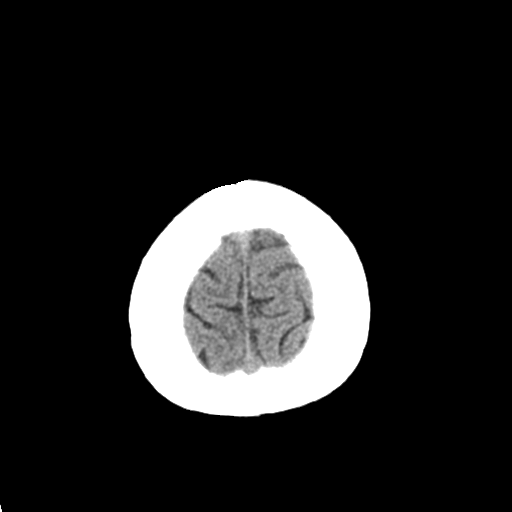
[im 24/29  bone]
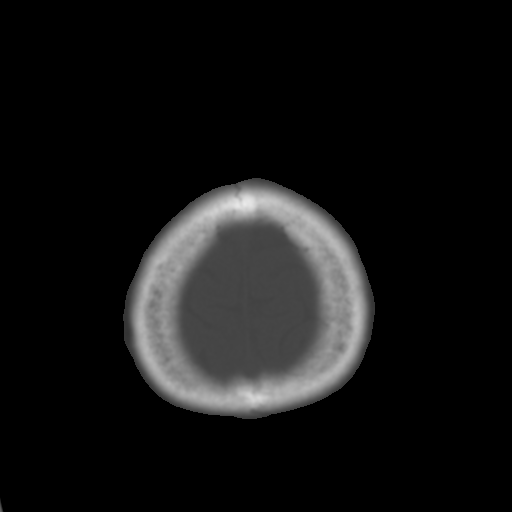
[im 27/29  brain]
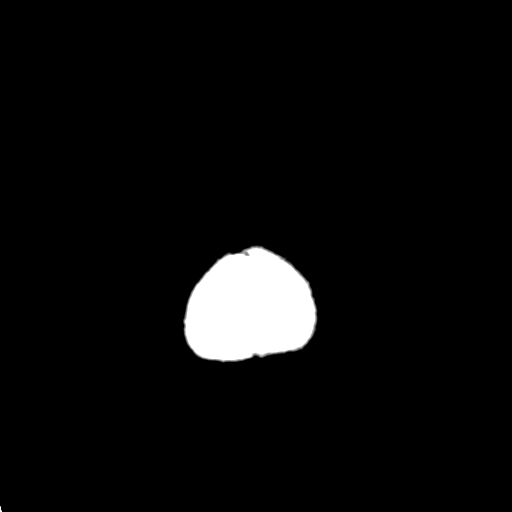

[Series 4: coronal soft tissue · coronal · 0.27mm/px · 3 of 61 slices shown]
[im 21/61  brain]
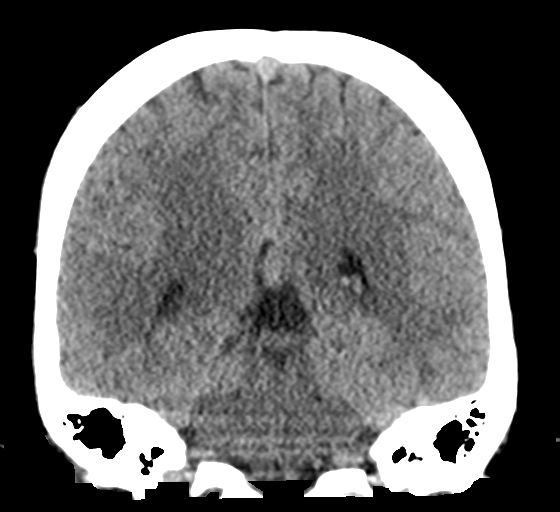
[im 27/61  brain]
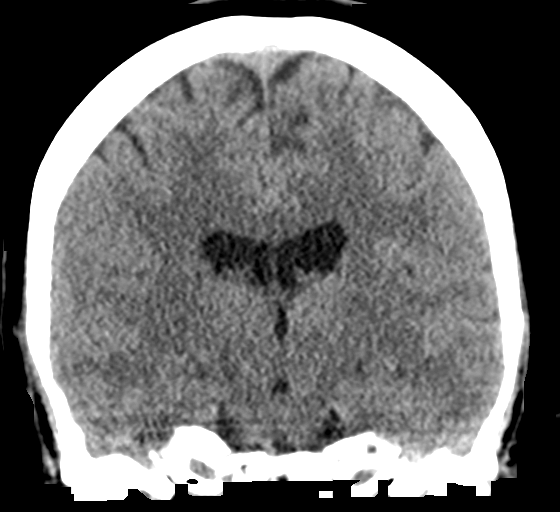
[im 34/61  brain]
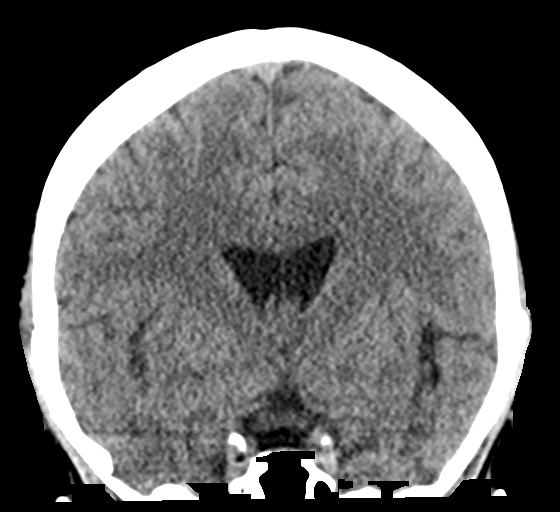

[Series 5: sagittal soft tissue · sagittal · 0.27mm/px · 3 of 52 slices shown]
[im 18/52  brain]
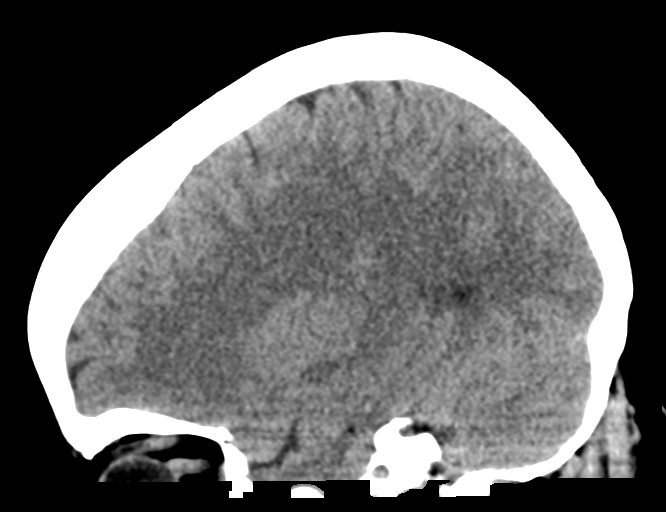
[im 26/52  brain]
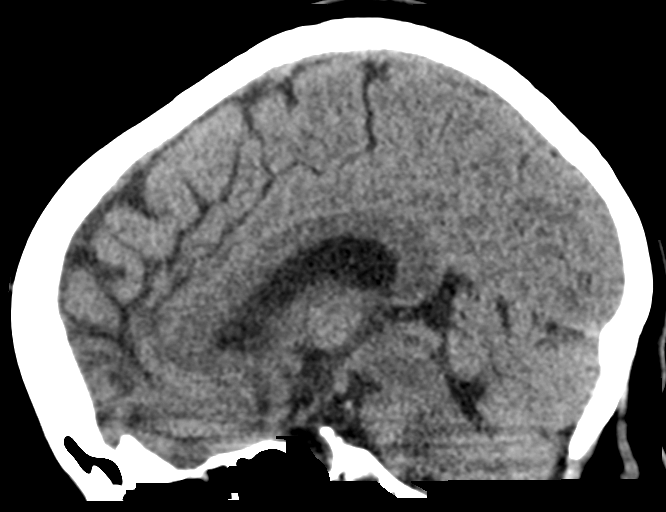
[im 35/52  brain]
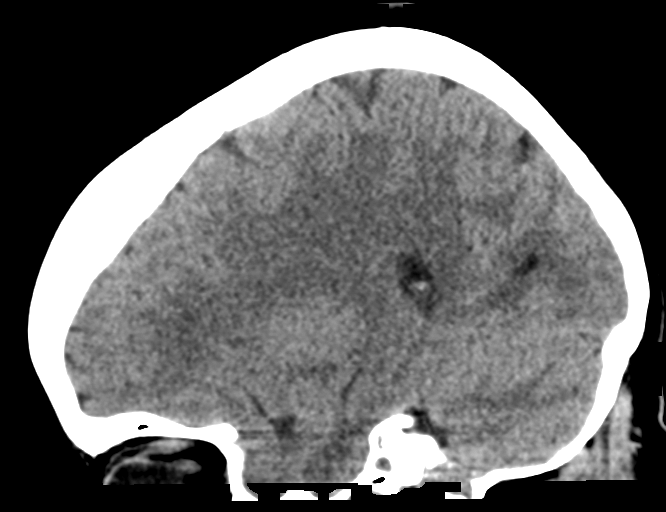

[16 of 46 positions shown; findings below may reference images not displayed]

FINDINGS: Brain: There is no mass, hemorrhage or extra-axial collection. The
size and configuration of the ventricles and extra-axial CSF spaces
are normal. The brain parenchyma is normal, without acute or chronic
infarction.

Vascular: No abnormal hyperdensity of the major intracranial
arteries or dural venous sinuses. No intracranial atherosclerosis.

Skull: The visualized skull base, calvarium and extracranial soft
tissues are normal.

Sinuses/Orbits: No fluid levels or advanced mucosal thickening of
the visualized paranasal sinuses. No mastoid or middle ear effusion.
The orbits are normal.
IMPRESSION: Normal head CT.

## 2023-05-08 DIAGNOSIS — K219 Gastro-esophageal reflux disease without esophagitis: Secondary | ICD-10-CM | POA: Diagnosis not present

## 2023-05-08 DIAGNOSIS — Z833 Family history of diabetes mellitus: Secondary | ICD-10-CM | POA: Diagnosis not present

## 2023-05-08 DIAGNOSIS — M1990 Unspecified osteoarthritis, unspecified site: Secondary | ICD-10-CM | POA: Diagnosis not present

## 2023-05-08 DIAGNOSIS — I10 Essential (primary) hypertension: Secondary | ICD-10-CM | POA: Diagnosis not present

## 2023-07-16 ENCOUNTER — Emergency Department: Payer: 59

## 2023-07-16 ENCOUNTER — Emergency Department
Admission: EM | Admit: 2023-07-16 | Discharge: 2023-07-16 | Disposition: A | Discharge status: Home / Self Care | Payer: 59 | Attending: Emergency Medicine | Admitting: Emergency Medicine

## 2023-07-16 ENCOUNTER — Other Ambulatory Visit: Payer: Self-pay

## 2023-07-16 DIAGNOSIS — J180 Bronchopneumonia, unspecified organism: Secondary | ICD-10-CM | POA: Insufficient documentation

## 2023-07-16 DIAGNOSIS — R918 Other nonspecific abnormal finding of lung field: Secondary | ICD-10-CM | POA: Diagnosis not present

## 2023-07-16 DIAGNOSIS — J45901 Unspecified asthma with (acute) exacerbation: Secondary | ICD-10-CM | POA: Diagnosis not present

## 2023-07-16 DIAGNOSIS — Z20822 Contact with and (suspected) exposure to covid-19: Secondary | ICD-10-CM | POA: Diagnosis not present

## 2023-07-16 DIAGNOSIS — J45909 Unspecified asthma, uncomplicated: Secondary | ICD-10-CM | POA: Diagnosis not present

## 2023-07-16 DIAGNOSIS — J189 Pneumonia, unspecified organism: Secondary | ICD-10-CM | POA: Diagnosis not present

## 2023-07-16 DIAGNOSIS — J4520 Mild intermittent asthma, uncomplicated: Secondary | ICD-10-CM

## 2023-07-16 DIAGNOSIS — R059 Cough, unspecified: Secondary | ICD-10-CM | POA: Diagnosis not present

## 2023-07-16 LAB — SARS CORONAVIRUS 2 BY RT PCR: SARS Coronavirus 2 by RT PCR: NEGATIVE

## 2023-07-16 MED ORDER — DOXYCYCLINE HYCLATE 100 MG PO CAPS: 100.0000 mg | ORAL_CAPSULE | Freq: Two times a day (BID) | ORAL | 0 refills | Status: DC

## 2023-07-16 MED ORDER — ALBUTEROL SULFATE HFA 108 (90 BASE) MCG/ACT IN AERS
2.0000 | INHALATION_SPRAY | Freq: Four times a day (QID) | RESPIRATORY_TRACT | 2 refills | Status: AC | PRN
Start: 2023-07-16 — End: ?

## 2023-07-16 MED ORDER — PREDNISONE 10 MG PO TABS: ORAL_TABLET | ORAL | 0 refills | Status: DC

## 2023-07-16 MED ORDER — IPRATROPIUM-ALBUTEROL 0.5-2.5 (3) MG/3ML IN SOLN
3.0000 mL | Freq: Once | RESPIRATORY_TRACT | Status: AC
  Administered 2023-07-16: 3 mL via RESPIRATORY_TRACT
  Filled 2023-07-16: qty 3

## 2023-07-16 NOTE — ED Provider Notes (Signed)
[facility] Provider Note    Event Date/Time   First MD Initiated Contact with Patient 07/16/23 1037     (approximate)   History   Cough   HPI History obtained by [facility] interpreter. Kaylee Edwards is a 57 y.o. female   is brought to the ED by daughter with complaint of coughing for the past 8 days.  Patient also reports generalized bodyaches and possible fever but denies chills.  Patient has a history of asthma and does not have any medication for this.  She has been taking Primatene Mist tablets over-the-counter with minimal relief.  Patient has an appointment with a new PCP scheduled but currently does not have anyone that can prescribe medication for her.  Patient has a history of asthma and GERD.      Physical Exam   Triage Vital Signs: ED Triage Vitals  Encounter Vitals Group     BP 07/16/23 0937 133/72     Systolic BP Percentile --      Diastolic BP Percentile --      Pulse Rate 07/16/23 0935 72     Resp 07/16/23 0935 18     Temp 07/16/23 0937 98.5 F (36.9 C)     Temp Source 07/16/23 0937 Oral     SpO2 07/16/23 0937 95 %     Weight 07/16/23 0936 126 lb (57.2 kg)     Height 07/16/23 0936 4\' 10"  (1.473 m)     Head Circumference --      Peak Flow --      Pain Score 07/16/23 0936 0     Pain Loc --      Pain Education --      Exclude from Growth Chart --     Most recent vital signs: Vitals:   07/16/23 0935 07/16/23 0937  BP:  133/72  Pulse: 72 81  Resp: 18 18  Temp:  98.5 F (36.9 C)  SpO2:  95%     General: Awake, no distress.  Alert, talkative, able to answer questions completely without any difficulty. CV:  Good peripheral perfusion.  Heart regular rate and rhythm. Resp:  Normal effort.  Bilateral expiratory wheezes are heard infrequently.  Patient does have a congested cough with some improvement of her wheezes.  No rales or rhonchi appreciated. Abd:  No distention.  Other:     ED Results / Procedures / Treatments    Labs (all labs ordered are listed, but only abnormal results are displayed) Labs Reviewed  SARS CORONAVIRUS 2 BY RT PCR     RADIOLOGY Chest x-ray images were reviewed by myself independent of the radiologist and noted to have increased markings in the left lower lobe.  Official radiology report indicates possible bronchopneumonia left lower lobe.    PROCEDURES:  Critical Care performed:   Procedures   MEDICATIONS ORDERED IN ED: Medications  ipratropium-albuterol (DUONEB) 0.5-2.5 (3) MG/3ML nebulizer solution 3 mL (3 mLs Nebulization Given 07/16/23 1152)     IMPRESSION / MDM / ASSESSMENT AND PLAN / ED COURSE  I reviewed the triage vital signs and the nursing notes.   Differential diagnosis includes, but is not limited to, COVID, influenza, viral URI, asthma exacerbation, bronchitis, pneumonia.  57 year old female presents to the ED with complaint of upper respiratory symptoms for approximately 8 days.  Patient has a history of asthma and currently is wheezing.  This improved after a DuoNeb treatment.  With the interpreter present patient was made aware that she does have  in addition to her asthma a bronchopneumonia.  A prescription for prednisone, albuterol inhaler and doxycycline was sent to the pharmacy.  Patient is strongly encouraged to keep her appointment with her PCP so that she can establish management care for her asthma.  Patient and daughter were made aware that she should return to the emergency department if any severe worsening of her symptoms.      Patient's presentation is most consistent with acute illness / injury with system symptoms.  FINAL CLINICAL IMPRESSION(S) / ED DIAGNOSES   Final diagnoses:  Bronchopneumonia  Mild asthma with exacerbation, unspecified whether persistent     Rx / DC Orders   ED Discharge Orders          Ordered    albuterol (VENTOLIN HFA) 108 (90 Base) MCG/ACT inhaler  Every 6 hours PRN        07/16/23 1243    predniSONE  (DELTASONE) 10 MG tablet        07/16/23 1243    doxycycline (VIBRAMYCIN) 100 MG capsule  2 times daily        07/16/23 1243             Note:  This document was prepared using Dragon voice recognition software and may include unintentional dictation errors.   Tommi Rumps, PA-C 07/16/23 1359    Janith Lima, MD 07/19/23 [phone]

## 2023-07-16 NOTE — Discharge Instructions (Signed)
Return to the emergency department if any severe worsening of your symptoms.  Keep your appointment with your scheduled primary care provider. Medication was sent to the pharmacy for you begin taking. You may also take Tylenol or ibuprofen as needed for fever, headache or bodyaches.

## 2023-07-16 NOTE — ED Triage Notes (Signed)
Pt presents to ED with c/o of coughing for the past 8 days, pt states dry cough. Pt states generalized body aches a few days ago. NAD noted. Pt states HX of asthma.

## 2023-08-15 DIAGNOSIS — E118 Type 2 diabetes mellitus with unspecified complications: Secondary | ICD-10-CM | POA: Diagnosis not present

## 2023-08-15 DIAGNOSIS — K219 Gastro-esophageal reflux disease without esophagitis: Secondary | ICD-10-CM | POA: Diagnosis not present

## 2023-08-15 DIAGNOSIS — Z1231 Encounter for screening mammogram for malignant neoplasm of breast: Secondary | ICD-10-CM | POA: Diagnosis not present

## 2023-08-15 DIAGNOSIS — Z7984 Long term (current) use of oral hypoglycemic drugs: Secondary | ICD-10-CM | POA: Diagnosis not present

## 2023-10-09 ENCOUNTER — Other Ambulatory Visit: Payer: Self-pay | Admitting: Physician Assistant

## 2023-10-09 DIAGNOSIS — Z1231 Encounter for screening mammogram for malignant neoplasm of breast: Secondary | ICD-10-CM

## 2024-02-01 ENCOUNTER — Emergency Department

## 2024-02-01 ENCOUNTER — Other Ambulatory Visit: Payer: Self-pay

## 2024-02-01 ENCOUNTER — Emergency Department
Admission: EM | Admit: 2024-02-01 | Discharge: 2024-02-01 | Disposition: A | Discharge status: Home / Self Care | Attending: Emergency Medicine | Admitting: Emergency Medicine

## 2024-02-01 DIAGNOSIS — X58XXXA Exposure to other specified factors, initial encounter: Secondary | ICD-10-CM | POA: Insufficient documentation

## 2024-02-01 DIAGNOSIS — E119 Type 2 diabetes mellitus without complications: Secondary | ICD-10-CM | POA: Diagnosis not present

## 2024-02-01 DIAGNOSIS — R051 Acute cough: Secondary | ICD-10-CM | POA: Diagnosis not present

## 2024-02-01 DIAGNOSIS — S299XXA Unspecified injury of thorax, initial encounter: Secondary | ICD-10-CM | POA: Diagnosis present

## 2024-02-01 DIAGNOSIS — S29011A Strain of muscle and tendon of front wall of thorax, initial encounter: Secondary | ICD-10-CM | POA: Insufficient documentation

## 2024-02-01 LAB — SARS CORONAVIRUS 2 BY RT PCR: SARS Coronavirus 2 by RT PCR: NEGATIVE

## 2024-02-01 MED ORDER — CYCLOBENZAPRINE HCL 10 MG PO TABS: 10.0000 mg | ORAL_TABLET | Freq: Three times a day (TID) | ORAL | 0 refills | Status: AC | PRN

## 2024-02-01 MED ORDER — CYCLOBENZAPRINE HCL 10 MG PO TABS
5.0000 mg | ORAL_TABLET | Freq: Once | ORAL | Status: AC
  Administered 2024-02-01: 5 mg via ORAL
  Filled 2024-02-01: qty 1

## 2024-02-01 MED ORDER — PSEUDOEPH-BROMPHEN-DM 30-2-10 MG/5ML PO SYRP: 5.0000 mL | ORAL_SOLUTION | Freq: Four times a day (QID) | ORAL | 0 refills | Status: AC | PRN

## 2024-02-01 NOTE — Discharge Instructions (Addendum)
 You have been diagnosed with cough, muscle strain of the chest wall.  Please take Flexeril 1 tablet by mouth 3 times daily after main meals for pain.  Please take cough syrup 5 mL by mouth 4 times daily as needed.  Please drink plenty fluids.  Come back to ED or go to your PCP if you have new symptoms or symptoms worsen.

## 2024-02-01 NOTE — ED Provider Notes (Signed)
 [facility] Provider Note    Event Date/Time   First MD Initiated Contact with Patient 02/01/24 1808     (approximate)   History   Back Pain and Cough    HPI  Kaylee Edwards is a 58 y.o. female   with a past medical history of diabetes, history of bronchopneumonia,  who presents to the ED complaining of left back pain associated to cough.   According to the patient, she has flulike symptoms in the last 5 days, in the last 24 hours left back pain that increases  with cough, and movement.  Patient denies fever, vomit, abdominal pain, diarrhea, urinary symptoms.      Physical Exam   Triage Vital Signs: ED Triage Vitals  Encounter Vitals Group     BP 02/01/24 1741 (!) 138/96     Systolic BP Percentile --      Diastolic BP Percentile --      Pulse Rate 02/01/24 1741 71     Resp 02/01/24 1741 20     Temp 02/01/24 1741 98.4 F (36.9 C)     Temp Source 02/01/24 1741 Oral     SpO2 02/01/24 1741 98 %     Weight 02/01/24 1742 134 lb (60.8 kg)     Height 02/01/24 1742 4\' 10"  (1.473 m)     Head Circumference --      Peak Flow --      Pain Score 02/01/24 1738 5     Pain Loc --      Pain Education --      Exclude from Growth Chart --     Most recent vital signs: Vitals:   02/01/24 1741  BP: (!) 138/96  Pulse: 71  Resp: 20  Temp: 98.4 F (36.9 C)  SpO2: 98%     Constitutional: Alert, NAD. Able to speak in complete sentences without cough or dyspnea  Eyes: Conjunctivae are normal.  Head: Atraumatic. Nose: No congestion/rhinnorhea. Mouth/Throat: Mucous membranes are moist.   Neck: Painless ROM. Supple. No JVD, nodes, thyromegaly  Cardiovascular:   Good peripheral circulation.RRR no murmurs, gallops, rubs  Respiratory: Normal respiratory effort.  No retractions. Clear to auscultation bilaterally without wheezing or crackles.  Left lower base  Gastrointestinal: Soft and nontender.  Musculoskeletal:  no deformity Neurologic:  MAE  spontaneously. No gross focal neurologic deficits are appreciated.  Skin:  Skin is warm, dry and intact. No rash noted. Psychiatric: Mood and affect are normal. Speech and behavior are normal.    ED Results / Procedures / Treatments   Labs (all labs ordered are listed, but only abnormal results are displayed) Labs Reviewed  SARS CORONAVIRUS 2 BY RT PCR     EKG     RADIOLOGY I independently reviewed and interpreted imaging and agree with radiologists findings.      PROCEDURES:  Critical Care performed:   Procedures   MEDICATIONS ORDERED IN ED: Medications  cyclobenzaprine  (FLEXERIL ) tablet 5 mg (5 mg Oral Given 02/01/24 1835)   Clinical Course as of 02/01/24 2019  Sun Feb 01, 2024  2016 DG Chest 2 View 1. No acute process. [AE]    Clinical Course User Index [AE] Awilda Lennox, PA-C    IMPRESSION / MDM / ASSESSMENT AND PLAN / ED COURSE  I reviewed the triage vital signs and the nursing notes.  Differential diagnosis includes, but is not limited to, pneumonia, bronchitis, influenza, COVID, muscle strain of the chest wall  Patient's presentation is most consistent with acute  complicated illness / injury requiring diagnostic workup.  Patient's diagnosis is consistent with chest wall muscle strain, cough. I independently reviewed and interpreted imaging and agree with radiologists findings no pneumonia or fracture. I did review the patient's allergies and medications.The patient is in stable and satisfactory condition for discharge home  Patient will be discharged home with prescriptions for Flexeril , meloxicam and cough syrup. Patient is to follow up with PCP as needed or otherwise directed. Patient is given ED precautions to return to the ED for any worsening or new symptoms. Discussed plan of care with patient, answered all of patient's questions, Patient agreeable to plan of care. Advised patient to take medications according to the instructions on the label.  Discussed possible side effects of new medications. Patient verbalized understanding.    FINAL CLINICAL IMPRESSION(S) / ED DIAGNOSES   Final diagnoses:  Acute cough  Muscle strain of chest wall, initial encounter     Rx / DC Orders   ED Discharge Orders          Ordered    cyclobenzaprine  (FLEXERIL ) 10 MG tablet  3 times daily PRN        02/01/24 2018    brompheniramine-pseudoephedrine-DM 30-2-10 MG/5ML syrup  4 times daily PRN        02/01/24 2018             Note:  This document was prepared using Dragon voice recognition software and may include unintentional dictation errors.   Awilda Lennox, PA-C 02/01/24 2019    Bryson Carbine, MD 02/16/24 [phone]

## 2024-02-01 NOTE — ED Triage Notes (Signed)
 Pt to ED for mid back pain since yesterday. Also cold and cough since 5 days ago. Pain is "squeezing" and comes and goes. Denies urinary symptoms. Pt not coughing in triage. Spanish speaking.

## 2024-05-14 ENCOUNTER — Other Ambulatory Visit: Payer: Self-pay | Admitting: Primary Care

## 2024-05-14 DIAGNOSIS — Z1231 Encounter for screening mammogram for malignant neoplasm of breast: Secondary | ICD-10-CM

## 2024-09-04 ENCOUNTER — Emergency Department
Admission: EM | Admit: 2024-09-04 | Discharge: 2024-09-04 | Disposition: A | Discharge status: Home / Self Care | Attending: Emergency Medicine | Admitting: Emergency Medicine

## 2024-09-04 ENCOUNTER — Other Ambulatory Visit: Payer: Self-pay

## 2024-09-04 ENCOUNTER — Emergency Department

## 2024-09-04 DIAGNOSIS — R102 Pelvic and perineal pain unspecified side: Secondary | ICD-10-CM | POA: Diagnosis present

## 2024-09-04 DIAGNOSIS — B9689 Other specified bacterial agents as the cause of diseases classified elsewhere: Secondary | ICD-10-CM | POA: Diagnosis not present

## 2024-09-04 DIAGNOSIS — N390 Urinary tract infection, site not specified: Secondary | ICD-10-CM | POA: Diagnosis not present

## 2024-09-04 DIAGNOSIS — R109 Unspecified abdominal pain: Secondary | ICD-10-CM | POA: Insufficient documentation

## 2024-09-04 LAB — URINALYSIS, ROUTINE W REFLEX MICROSCOPIC
Bilirubin Urine: NEGATIVE
Glucose, UA: NEGATIVE mg/dL
Ketones, ur: NEGATIVE mg/dL
Nitrite: NEGATIVE
Protein, ur: NEGATIVE mg/dL
Specific Gravity, Urine: 1.016 (ref 1.005–1.030)
WBC, UA: >50 WBC/hpf (ref 0–5)
pH: 5 (ref 5.0–8.0)

## 2024-09-04 LAB — COMPREHENSIVE METABOLIC PANEL WITH GFR
ALT: 76 U/L — ABNORMAL HIGH (ref 0–44)
AST: 63 U/L — ABNORMAL HIGH (ref 15–41)
Albumin: 4.4 g/dL (ref 3.5–5.0)
Alkaline Phosphatase: 123 U/L (ref 38–126)
Anion gap: 13 (ref 5–15)
BUN: 29 mg/dL — ABNORMAL HIGH (ref 6–20)
CO2: 21 mmol/L — ABNORMAL LOW (ref 22–32)
Calcium: 9.5 mg/dL (ref 8.9–10.3)
Chloride: 108 mmol/L (ref 98–111)
Creatinine, Ser: 0.83 mg/dL (ref 0.44–1.00)
GFR, Estimated: >60 mL/min (ref >60)
Glucose, Bld: 140 mg/dL — ABNORMAL HIGH (ref 70–99)
Potassium: 4.2 mmol/L (ref 3.5–5.1)
Sodium: 141 mmol/L (ref 135–145)
Total Bilirubin: 0.4 mg/dL (ref 0.0–1.2)
Total Protein: 7.5 g/dL (ref 6.5–8.1)

## 2024-09-04 LAB — CBC WITH DIFFERENTIAL/PLATELET
Abs Immature Granulocytes: 0.09 K/uL — ABNORMAL HIGH (ref 0.00–0.07)
Basophils Absolute: 0 K/uL (ref 0.0–0.1)
Basophils Relative: 0 %
Eosinophils Absolute: 0.3 K/uL (ref 0.0–0.5)
Eosinophils Relative: 2 %
HCT: 36.2 % (ref 36.0–46.0)
Hemoglobin: 11.8 g/dL — ABNORMAL LOW (ref 12.0–15.0)
Immature Granulocytes: 1 %
Lymphocytes Relative: 28 %
Lymphs Abs: 3 K/uL (ref 0.7–4.0)
MCH: 26.8 pg (ref 26.0–34.0)
MCHC: 32.6 g/dL (ref 30.0–36.0)
MCV: 82.3 fL (ref 80.0–100.0)
Monocytes Absolute: 0.6 K/uL (ref 0.1–1.0)
Monocytes Relative: 5 %
Neutro Abs: 6.9 K/uL (ref 1.7–7.7)
Neutrophils Relative %: 64 %
Platelets: 369 K/uL (ref 150–400)
RBC: 4.4 MIL/uL (ref 3.87–5.11)
RDW: 13.3 % (ref 11.5–15.5)
WBC: 10.9 K/uL — ABNORMAL HIGH (ref 4.0–10.5)
nRBC: 0 % (ref 0.0–0.2)

## 2024-09-04 LAB — LIPASE, BLOOD: Lipase: 33 U/L (ref 11–51)

## 2024-09-04 MED ORDER — LACTATED RINGERS IV BOLUS
1000.0000 mL | Freq: Once | INTRAVENOUS | Status: AC
  Administered 2024-09-04: 1000 mL via INTRAVENOUS

## 2024-09-04 MED ORDER — OXYCODONE-ACETAMINOPHEN 5-325 MG PO TABS
1.0000 | ORAL_TABLET | ORAL | 0 refills | Status: AC | PRN
Start: 2024-09-04 — End: ?

## 2024-09-04 MED ORDER — OXYCODONE-ACETAMINOPHEN 5-325 MG PO TABS
1.0000 | ORAL_TABLET | Freq: Once | ORAL | Status: AC
  Administered 2024-09-04: 1 via ORAL
  Filled 2024-09-04: qty 1

## 2024-09-04 MED ORDER — SODIUM CHLORIDE 0.9 % IV SOLN
1.0000 g | INTRAVENOUS | Status: AC
  Administered 2024-09-04: 1 g via INTRAVENOUS
  Filled 2024-09-04: qty 10

## 2024-09-04 MED ORDER — MORPHINE SULFATE (PF) 4 MG/ML IV SOLN
4.0000 mg | Freq: Once | INTRAVENOUS | Status: AC
  Administered 2024-09-04: 4 mg via INTRAVENOUS
  Filled 2024-09-04: qty 1

## 2024-09-04 MED ORDER — ONDANSETRON HCL 4 MG/2ML IJ SOLN
4.0000 mg | INTRAMUSCULAR | Status: AC
  Administered 2024-09-04: 4 mg via INTRAVENOUS
  Filled 2024-09-04: qty 2

## 2024-09-04 MED ORDER — KETOROLAC TROMETHAMINE 30 MG/ML IJ SOLN
15.0000 mg | Freq: Once | INTRAMUSCULAR | Status: AC
  Administered 2024-09-04: 15 mg via INTRAVENOUS
  Filled 2024-09-04: qty 1

## 2024-09-04 MED ORDER — IOHEXOL 300 MG/ML  SOLN
100.0000 mL | Freq: Once | INTRAMUSCULAR | Status: AC | PRN
  Administered 2024-09-04: 100 mL via INTRAVENOUS

## 2024-09-04 MED ORDER — CEPHALEXIN 500 MG PO CAPS: 500.0000 mg | ORAL_CAPSULE | Freq: Three times a day (TID) | ORAL | 0 refills | Status: AC

## 2024-09-04 NOTE — ED Triage Notes (Signed)
 Pt reports left sided flank pain that started over hte past week - this pain has now resolved. Is concerned for lower abdominal pain that radiates to her bilateral sided that started yesterday Friday morning. Associated with nausea. No urinary s/s. Hx of hysterectomy in 2006. Has taken naproxen without relief.    Pt requires spanish speaking interpreter

## 2024-09-04 NOTE — ED Provider Notes (Signed)
 [facility] Provider Note    Event Date/Time   First MD Initiated Contact with Patient 09/04/24 [phone]     (approximate)   History   Abdominal Pain  The patient and/or family speak(s) Spanish.  They understand they have the right to the use of a hospital interpreter, however at this time they prefer to speak directly with me in Spanish.  They know that they can ask for an interpreter at any time.   HPI Kaylee Edwards is a 58 y.o. female who denies any chronic pain but has had prior surgeries including bilateral salpingo-oophorectomy and hysterectomy.  She presents for evaluation of pelvic pain that radiates around to both sides.  She said that over the last week she has had mostly left-sided flank pain but it went away yesterday.  Tonight she woke up with severe pain in the lower part of her abdomen that radiates to both sides.  It is associated with nausea and vomiting.  Naproxen has not helped.  The pain is intense and will not go away.  No diarrhea.  No chest pain or shortness of breath.     Physical Exam   Triage Vital Signs: ED Triage Vitals  Encounter Vitals Group     BP 09/04/24 0502 (!) 164/94     Girls Systolic BP Percentile --      Girls Diastolic BP Percentile --      Boys Systolic BP Percentile --      Boys Diastolic BP Percentile --      Pulse Rate 09/04/24 0502 65     Resp 09/04/24 0502 19     Temp 09/04/24 0502 98.5 F (36.9 C)     Temp Source 09/04/24 0502 Oral     SpO2 09/04/24 0502 100 %     Weight --      Height --      Head Circumference --      Peak Flow --      Pain Score 09/04/24 0500 10     Pain Loc --      Pain Education --      Exclude from Growth Chart --     Most recent vital signs: Vitals:   09/04/24 0645 09/04/24 0700  BP:  (!) 141/73  Pulse: 83 69  Resp:    Temp:    SpO2: 98% 96%    General: Awake, alert, obviously in pain with tears in her eyes but nontoxic appearance. CV:  Good peripheral perfusion.   Regular rate and rhythm. Resp:  Normal effort. Speaking easily and comfortably, no accessory muscle usage nor intercostal retractions.   Abd:  Abdomen is soft.  Patient has generalized tenderness to palpation throughout the lower abdomen but with no localized peritonitis.  She also has tenderness to percussion in bilateral flanks.  There is some voluntary guarding.  However, she has no tenderness to palpation of the epigastrium nor of the right upper quadrant with negative Murphy sign.   ED Results / Procedures / Treatments   Labs (all labs ordered are listed, but only abnormal results are displayed) Labs Reviewed  URINALYSIS, ROUTINE W REFLEX MICROSCOPIC - Abnormal; Notable for the following components:      Result Value   Color, Urine YELLOW (*)    APPearance HAZY (*)    Hgb urine dipstick SMALL (*)    Leukocytes,Ua SMALL (*)    Bacteria, UA MANY (*)    All other components within normal limits  COMPREHENSIVE METABOLIC PANEL  WITH GFR - Abnormal; Notable for the following components:   CO2 21 (*)    Glucose, Bld 140 (*)    BUN 29 (*)    AST 63 (*)    ALT 76 (*)    All other components within normal limits  CBC WITH DIFFERENTIAL/PLATELET - Abnormal; Notable for the following components:   WBC 10.9 (*)    Hemoglobin 11.8 (*)    Abs Immature Granulocytes 0.09 (*)    All other components within normal limits  URINE CULTURE  LIPASE, BLOOD     RADIOLOGY See ED course for details   PROCEDURES:  Critical Care performed: No  Procedures    IMPRESSION / MDM / ASSESSMENT AND PLAN / ED COURSE  I reviewed the triage vital signs and the nursing notes.                              Differential diagnosis includes, but is not limited to, renal/ureteral colic, UTI/pyelonephritis, SBO/ileus.  Patient's presentation is most consistent with acute presentation with potential threat to life or bodily function.  Labs/studies ordered: Urinalysis, CMP, lipase, CBC with  differential  Interventions/Medications given:  Medications  cefTRIAXone (ROCEPHIN) 1 g in sodium chloride 0.9 % 100 mL IVPB (1 g Intravenous New Bag/Given 09/04/24 0707)  morphine (PF) 4 MG/ML injection 4 mg (4 mg Intravenous Given 09/04/24 0528)  ketorolac (TORADOL) 30 MG/ML injection 15 mg (15 mg Intravenous Given 09/04/24 0528)  ondansetron (ZOFRAN) injection 4 mg (4 mg Intravenous Given 09/04/24 0528)  lactated ringers  bolus 1,000 mL (0 mLs Intravenous Stopped 09/04/24 0706)  iohexol (OMNIPAQUE) 300 MG/ML solution 100 mL (100 mLs Intravenous Contrast Given 09/04/24 0623)  morphine (PF) 4 MG/ML injection 4 mg (4 mg Intravenous Given 09/04/24 9356)    (Note:  hospital course my include additional interventions and/or labs/studies not listed above.)   Patient is in obvious discomfort and I ordered 4 mg IV morphine, 15 mg of IV Toradol, and Zofran 4 mg IV.  Proceeding with lab work and will need urine specimen.  I am ordering a liter of fluids to help with hydration as well as to promote urination assuming that this is going to be a ureteral stone.     Clinical Course as of 09/04/24 9287  Sat Sep 04, 2024  0606 Labs are all generally reassuring.  Very slight elevation of AST and ALT, unclear clinical significance given that the physical exam does not correlate with biliary colic.  No significant leukocytosis.  Proceeding with CT of the abdomen pelvis with IV contrast at this time. [CF]  [phone] Urinalysis, Routine w reflex microscopic -Urine, Clean Catch(!) Urine appears grossly infected and I will treat empirically with ceftriaxone 1 g IV as well as sending a urine culture. [CF]  [phone] Patient still reporting severe pain, ordering another morphine 4 mg IV [CF]  0650 I independently viewed and interpreted the patient's abd/pelvis CT, as well as reviewing the radiologist's report.  The patient has what appears to be a right ovarian cyst, which is somewhat surprising because the patient was  under the impression she had a bilateral salpingo-oophorectomy.  Regardless, the cause of her pain tonight is most likely a ureteral stone in the left ureter which the radiologist identified is being about 6 mm x 4 mm with some associated hydronephrosis and hydroureter.  These findings are somewhat worrisome in the setting of what appears to be a clear UTI.  I  will consult urology given the possible need to stint placement. [CF]  0712 I updated the patient about the transfer of care to Dr. Dorothyann in the emergency department and his plan to consult with urology regarding the stone.  I also let the patient know that she appears to have a right ovary even though she was under the impression that it had been removed and I encouraged her to follow-up with her regular doctor as an outpatient regarding this somewhat surprising finding. [CF]    Clinical Course User Index [CF] Gordan Huxley, MD     FINAL CLINICAL IMPRESSION(S) / ED DIAGNOSES   Final diagnoses:  Pelvic pain  Urinary tract infection without hematuria, site unspecified     Rx / DC Orders   ED Discharge Orders     None        Note:  This document was prepared using Dragon voice recognition software and may include unintentional dictation errors.   Gordan Huxley, MD 09/04/24 [phone]

## 2024-09-04 NOTE — ED Provider Notes (Signed)
-----------------------------------------   7:56 AM on 09/04/2024 -----------------------------------------  Spanish interpreter used for this evaluation.  I spoke with Dr. Carolee of urology given the patient's urinary tract infection and 6 x 4 distal ureteral stone.  He believes the patient would be safe for discharge home given no fever minimal white count.  Patient is feeling much better in the emergency department.  I used a Spanish interpreter to discuss return precautions for any fever or worsening pain.  She will call urology on Monday to arrange a follow-up appointment.  Also discussed the ovarian cyst to follow-up with her doctor for repeat ultrasound in 6 months.  Patient is agreeable to this plan and is ready to go home.   Dorothyann Drivers, MD 09/04/24 [phone]

## 2024-09-04 NOTE — Discharge Instructions (Signed)
 Please call the number provided for urology to arrange a follow-up appointment.  Return to the emergency department for any fever or any worsening pain.  Please take your antibiotics for their entire course.  Please take your pain medication as needed but only as prescribed.  Do not drink alcohol or drive while taking pain medication.

## 2024-09-06 LAB — URINE CULTURE: Culture: >=100000 — AB

## 2024-09-13 ENCOUNTER — Ambulatory Visit: Admitting: Urology

## 2024-09-13 VITALS — BP 166/81 | HR 77 | Ht 59.0 in | Wt 130.0 lb

## 2024-09-13 DIAGNOSIS — N390 Urinary tract infection, site not specified: Secondary | ICD-10-CM

## 2024-09-13 DIAGNOSIS — N201 Calculus of ureter: Secondary | ICD-10-CM

## 2024-09-14 ENCOUNTER — Other Ambulatory Visit: Payer: Self-pay | Admitting: Urology

## 2024-09-14 ENCOUNTER — Encounter: Payer: Self-pay | Admitting: Urology

## 2024-09-14 DIAGNOSIS — N201 Calculus of ureter: Secondary | ICD-10-CM

## 2024-09-14 LAB — URINALYSIS, COMPLETE
Bilirubin, UA: NEGATIVE
Glucose, UA: NEGATIVE
Ketones, UA: NEGATIVE
Leukocytes,UA: NEGATIVE
Nitrite, UA: NEGATIVE
Protein,UA: NEGATIVE
RBC, UA: NEGATIVE
Specific Gravity, UA: 1.02 (ref 1.005–1.030)
Urobilinogen, Ur: 0.2 mg/dL (ref 0.2–1.0)
pH, UA: 6 (ref 5.0–7.5)

## 2024-09-14 LAB — MICROSCOPIC EXAMINATION: Bacteria, UA: NONE SEEN

## 2024-09-14 NOTE — Addendum Note (Signed)
 Addended by: TWYLLA GLENDIA BROCKS on: 09/14/2024 08:48 AM   Modules accepted: Orders

## 2024-09-14 NOTE — Progress Notes (Signed)
 09/13/2024 8:31 AM   Kaylee Edwards 14-Apr-1966 [id number]  Referring provider: [facility] [address] [place],  [place] [address]  Chief Complaint  Patient presents with   Recurrent UTI    HPI: Kaylee Edwards is a 58 y.o. female presents in follow-up of a recent ED visit for renal colic.  Her husband was with her today and a Spanish interpreter was present via video link.  Presented to [facility] ED 09/04/2024 with a 1 week history of left flank pain radiating to the left lower quadrant.  Associated with nausea but no vomiting.  No bothersome lower urinary tract symptoms.  Pain was rated severe at the time of her presentation. Evaluation in the ED remarkable for a urinalysis which showed >50 WBC/21-50 RBC.  Urine culture subsequently grew 10,000 colonies E. coli.  CT abdomen pelvis with contrast remarkable for a delayed left nephrogram and moderate-severe left hydronephrosis/hydroureter to a 4 x 6 mm left distal ureteral calculus She received ceftriaxone , parenteral analgesics.  On-call urologist was contacted and it was recommended close follow-up in the absence of leukocytosis and fever. Since her ED visit she has been asymptomatic and thinks she may have passed her stone.  No complaints today   PMH: Past Medical History:  Diagnosis Date   Asthma    GERD (gastroesophageal reflux disease)     Surgical History: Past Surgical History:  Procedure Laterality Date   ABDOMINAL HYSTERECTOMY     LAPAROSCOPIC BILATERAL SALPINGO OOPHERECTOMY Bilateral    OVARIAN CYST REMOVAL Bilateral 2006    Home Medications:  Allergies as of 09/13/2024       Reactions   Asa [aspirin]         Medication List        Accurate as of September 13, 2024 11:59 PM. If you have any questions, ask your nurse or doctor.          STOP taking these medications    benzonatate  100 MG capsule Commonly known as: TESSALON  Stopped by: Glendia JAYSON Barba    doxycycline  100 MG capsule Commonly known as: VIBRAMYCIN  Stopped by: Glendia JAYSON Barba   predniSONE  10 MG tablet Commonly known as: DELTASONE  Stopped by: Glendia JAYSON Barba       TAKE these medications    albuterol  108 (90 Base) MCG/ACT inhaler Commonly known as: VENTOLIN  HFA Inhale 2 puffs into the lungs every 6 (six) hours as needed for wheezing or shortness of breath.   brompheniramine-pseudoephedrine-DM 30-2-10 MG/5ML syrup Take 5 mLs by mouth 4 (four) times daily as needed.   cephALEXin  500 MG capsule Commonly known as: KEFLEX  Take 1 capsule (500 mg total) by mouth 3 (three) times daily.   omeprazole  20 MG capsule Commonly known as: PRILOSEC Take 1 capsule (20 mg total) by mouth daily.   oxyCODONE -acetaminophen  5-325 MG tablet Commonly known as: Percocet Take 1 tablet by mouth every 4 (four) hours as needed for severe pain (pain score 7-10).        Allergies:  Allergies  Allergen Reactions   Asa [Aspirin]     Family History: Family History  Problem Relation Age of Onset   Diabetes Mother     Social History:  reports that she has never smoked. She has never used smokeless tobacco. She reports that she does not drink alcohol and does not use drugs.   Physical Exam: BP (!) 166/81   Pulse 77   Ht 4' 11 (1.499 m)   Wt 130 lb (59 kg)  BMI 26.26 kg/m   Constitutional:  Alert, No acute distress. HEENT: [place] AT Respiratory: Normal respiratory effort, no increased work of breathing.  Psychiatric: Normal mood and affect.  Laboratory Data:  Urinalysis Dipstick/microscopy negative   Pertinent Imaging: CT images were personally reviewed and interpreted  Assessment & Plan:    1.  Left distal ureteral calculus She has been asymptomatic and urinalysis today was clear She has most likely passed her stone.  Will schedule a follow-up noncontrast pelvic CT We discussed general stone prevention guidelines 1 year follow-up with KUB   Glendia JAYSON Barba,  MD  [facility] [address] [place], [place] [address] [phone]

## 2024-09-17 ENCOUNTER — Other Ambulatory Visit: Payer: Self-pay | Admitting: *Deleted

## 2024-09-17 ENCOUNTER — Telehealth: Payer: Self-pay | Admitting: *Deleted

## 2024-09-17 DIAGNOSIS — N201 Calculus of ureter: Secondary | ICD-10-CM

## 2024-09-17 NOTE — Telephone Encounter (Signed)
 Left message on voice mail about ct scan ay [facility]

## 2024-09-17 NOTE — Addendum Note (Signed)
 Addended by: GENITA HARLENE CROME on: 09/17/2024 08:18 AM   Modules accepted: Orders

## 2024-10-13 ENCOUNTER — Telehealth: Payer: Self-pay | Admitting: *Deleted

## 2024-10-13 NOTE — Telephone Encounter (Signed)
-----   Message from Glendia Barba, MD sent at 10/02/2024 11:12 AM EST ----- Regarding: CT Patient never scheduled recommended follow-up CT pelvis without contrast.  Please contact

## 2024-10-13 NOTE — Telephone Encounter (Signed)
"  Left message to return the call   "
